# Patient Record
Sex: Male | Born: 1990
Health system: Southern US, Community
[De-identification: ages and names within clinical notes are randomized; demographics above are authoritative.]

## PROBLEM LIST (undated history)

## (undated) DIAGNOSIS — R011 Cardiac murmur, unspecified: Secondary | ICD-10-CM

## (undated) DIAGNOSIS — R079 Chest pain, unspecified: Secondary | ICD-10-CM

## (undated) DIAGNOSIS — Z9889 Other specified postprocedural states: Secondary | ICD-10-CM

## (undated) DIAGNOSIS — Z8489 Family history of other specified conditions: Secondary | ICD-10-CM

## (undated) DIAGNOSIS — A048 Other specified bacterial intestinal infections: Secondary | ICD-10-CM

## (undated) DIAGNOSIS — F419 Anxiety disorder, unspecified: Secondary | ICD-10-CM

## (undated) DIAGNOSIS — K8020 Calculus of gallbladder without cholecystitis without obstruction: Secondary | ICD-10-CM

## (undated) DIAGNOSIS — M25511 Pain in right shoulder: Secondary | ICD-10-CM

## (undated) DIAGNOSIS — M545 Low back pain, unspecified: Secondary | ICD-10-CM

## (undated) DIAGNOSIS — R0600 Dyspnea, unspecified: Secondary | ICD-10-CM

## (undated) DIAGNOSIS — G8929 Other chronic pain: Secondary | ICD-10-CM

## (undated) DIAGNOSIS — E785 Hyperlipidemia, unspecified: Secondary | ICD-10-CM

## (undated) DIAGNOSIS — E559 Vitamin D deficiency, unspecified: Secondary | ICD-10-CM

## (undated) DIAGNOSIS — G43409 Hemiplegic migraine, not intractable, without status migrainosus: Secondary | ICD-10-CM

## (undated) DIAGNOSIS — R030 Elevated blood-pressure reading, without diagnosis of hypertension: Secondary | ICD-10-CM

## (undated) DIAGNOSIS — R112 Nausea with vomiting, unspecified: Secondary | ICD-10-CM

## (undated) HISTORY — DX: Cardiac murmur, unspecified: R01.1

## (undated) HISTORY — DX: Anxiety disorder, unspecified: F41.9

## (undated) HISTORY — PX: KNEE ARTHROSCOPY: SUR90

## (undated) HISTORY — DX: Chest pain, unspecified: R07.9

## (undated) HISTORY — DX: Vitamin D deficiency, unspecified: E55.9

## (undated) HISTORY — DX: Pain in right shoulder: M25.511

## (undated) HISTORY — DX: Other specified bacterial intestinal infections: A04.8

## (undated) HISTORY — DX: Other chronic pain: G89.29

## (undated) HISTORY — DX: Low back pain, unspecified: M54.50

## (undated) HISTORY — DX: Dyspnea, unspecified: R06.00

## (undated) HISTORY — DX: Hemiplegic migraine, not intractable, without status migrainosus: G43.409

## (undated) HISTORY — DX: Hyperlipidemia, unspecified: E78.5

## (undated) HISTORY — DX: Low back pain: M54.5

## (undated) HISTORY — DX: Elevated blood-pressure reading, without diagnosis of hypertension: R03.0

## (undated) HISTORY — DX: Calculus of gallbladder without cholecystitis without obstruction: K80.20

---
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
## 2008-08-14 ENCOUNTER — Emergency Department (HOSPITAL_COMMUNITY): Admission: EM | Admit: 2008-08-14 | Discharge: 2008-08-14 | Payer: Self-pay | Admitting: Emergency Medicine

## 2010-10-23 ENCOUNTER — Emergency Department (HOSPITAL_COMMUNITY)
Admission: EM | Admit: 2010-10-23 | Discharge: 2010-10-23 | Disposition: A | Discharge status: Home / Self Care | Payer: BC Managed Care – PPO | Attending: Emergency Medicine | Admitting: Emergency Medicine

## 2012-08-12 ENCOUNTER — Emergency Department (HOSPITAL_COMMUNITY): Payer: Self-pay

## 2012-08-12 ENCOUNTER — Encounter (HOSPITAL_COMMUNITY): Payer: Self-pay | Admitting: Emergency Medicine

## 2012-08-12 ENCOUNTER — Emergency Department (HOSPITAL_COMMUNITY)
Admission: EM | Admit: 2012-08-12 | Discharge: 2012-08-12 | Disposition: A | Discharge status: Home / Self Care | Payer: Self-pay | Attending: Emergency Medicine | Admitting: Emergency Medicine

## 2012-08-12 DIAGNOSIS — S0990XA Unspecified injury of head, initial encounter: Secondary | ICD-10-CM | POA: Insufficient documentation

## 2012-08-12 DIAGNOSIS — Y92410 Unspecified street and highway as the place of occurrence of the external cause: Secondary | ICD-10-CM | POA: Insufficient documentation

## 2012-08-12 DIAGNOSIS — S8001XA Contusion of right knee, initial encounter: Secondary | ICD-10-CM

## 2012-08-12 DIAGNOSIS — R011 Cardiac murmur, unspecified: Secondary | ICD-10-CM | POA: Insufficient documentation

## 2012-08-12 DIAGNOSIS — S8000XA Contusion of unspecified knee, initial encounter: Secondary | ICD-10-CM | POA: Insufficient documentation

## 2012-08-12 DIAGNOSIS — S239XXA Sprain of unspecified parts of thorax, initial encounter: Secondary | ICD-10-CM

## 2012-08-12 DIAGNOSIS — Y9389 Activity, other specified: Secondary | ICD-10-CM | POA: Insufficient documentation

## 2012-08-12 DIAGNOSIS — S298XXA Other specified injuries of thorax, initial encounter: Secondary | ICD-10-CM | POA: Insufficient documentation

## 2012-08-12 HISTORY — DX: Cardiac murmur, unspecified: R01.1

## 2012-08-12 LAB — POCT I-STAT, CHEM 8
BUN: 11 mg/dL (ref 6–23)
Calcium, Ion: 1.19 mmol/L (ref 1.12–1.23)
HCT: 44 % (ref 39.0–52.0)
Sodium: 144 mEq/L (ref 135–145)
TCO2: 25 mmol/L (ref 0–100)

## 2012-08-12 LAB — CBC WITH DIFFERENTIAL/PLATELET
Basophils Absolute: 0 10*3/uL (ref 0.0–0.1)
Basophils Relative: 0 % (ref 0–1)
Eosinophils Relative: 3 % (ref 0–5)
Lymphocytes Relative: 27 % (ref 12–46)
MCHC: 34.9 g/dL (ref 30.0–36.0)
MCV: 82.1 fL (ref 78.0–100.0)
Neutro Abs: 6.1 10*3/uL (ref 1.7–7.7)
Platelets: 205 10*3/uL (ref 150–400)
RDW: 13.8 % (ref 11.5–15.5)
WBC: 9.8 10*3/uL (ref 4.0–10.5)

## 2012-08-12 MED ORDER — MORPHINE SULFATE 4 MG/ML IJ SOLN
4.0000 mg | Freq: Once | INTRAMUSCULAR | Status: AC
  Administered 2012-08-12: 4 mg via INTRAVENOUS
  Filled 2012-08-12: qty 1

## 2012-08-12 MED ORDER — IBUPROFEN 800 MG PO TABS: 800.0000 mg | ORAL_TABLET | Freq: Three times a day (TID) | ORAL | Status: DC | PRN

## 2012-08-12 MED ORDER — HYDROCODONE-ACETAMINOPHEN 5-325 MG PO TABS: 1.0000 | ORAL_TABLET | Freq: Four times a day (QID) | ORAL | Status: DC | PRN
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
## 2012-08-12 NOTE — ED Notes (Signed)
Patient report obtained from [facility], care taken over at this time.
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
## 2012-08-12 NOTE — ED Notes (Signed)
Pt to XR

## 2012-08-12 NOTE — ED Notes (Signed)
Patient laying on stretcher at this time. States he is having pain in head, neck and back. Rates pain at 6/10. No acute distress noted, resp are even and unlabored. Patient currently wearing c-collar. Plan of care discussed with patient, awaiting results. Call light at bedside with patient. Will continue to monitor.
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
## 2016-08-07 DIAGNOSIS — H9202 Otalgia, left ear: Secondary | ICD-10-CM | POA: Insufficient documentation

## 2016-08-07 NOTE — ED Triage Notes (Signed)
Pt was cleaning left ear with a qtip and believes he might have injured his ear while doing so. Co pain to earache at this time.

## 2016-08-08 ENCOUNTER — Emergency Department
Admission: EM | Admit: 2016-08-08 | Discharge: 2016-08-08 | Disposition: A | Discharge status: Home / Self Care | Payer: Self-pay | Attending: Emergency Medicine | Admitting: Emergency Medicine

## 2016-08-08 DIAGNOSIS — H9202 Otalgia, left ear: Secondary | ICD-10-CM

## 2016-08-08 MED ORDER — IBUPROFEN 800 MG PO TABS
800.0000 mg | ORAL_TABLET | Freq: Once | ORAL | Status: AC
  Administered 2016-08-08: 800 mg via ORAL

## 2016-08-08 MED ORDER — IBUPROFEN 800 MG PO TABS
ORAL_TABLET | ORAL | Status: AC
  Administered 2016-08-08: 800 mg via ORAL
  Filled 2016-08-08: qty 1

## 2016-08-08 NOTE — Discharge Instructions (Signed)
Please take ibuprofen and/or Tylenol for pain. Please contact the ear, nose, and throat doctor later today for follow-up in the office. Return especially for fever, persistent nausea vomiting decreased hearing or any other new concerns.  Please return immediately if condition worsens. Please contact her primary physician or the physician you were given for referral. If you have any specialist physicians involved in her treatment and plan please also contact them. Thank you for using [facility].

## 2016-08-08 NOTE — ED Provider Notes (Signed)
Time Seen: Approximately0345 I have reviewed the triage notes  Chief Complaint: Otalgia   History of Present Illness: Darrell Hughes is a 26 y.o. male *who states that earlier this evening that he injured his left ear while using a Q-tip. Patient states he feels that the Q-tip broke off and got lodged in his ureter canal. Some left ear pain without discharge or drainage.   Past Medical History:  Diagnosis Date  . Murmur, cardiac     There are no active problems to display for this patient.   Past Surgical History:  Procedure Laterality Date  . KNEE ARTHROSCOPY      Past Surgical History:  Procedure Laterality Date  . KNEE ARTHROSCOPY      Current Outpatient Rx  . Order #: [id number] Class: Print  . Order #: [id number] Class: Print    Allergies:  Patient has no known allergies.  Family History: No family history on file.  Social History: Social History  Substance Use Topics  . Smoking status: Never Smoker  . Smokeless tobacco: Not on file  . Alcohol use No     Review of Systems:   Constitutional: No fever Eyes: No visual disturbances ENT: No sore throat, No right ear pain or deafness Cardiac: No chest pain Respiratory: No shortness of breath, wheezing, or stridor Neurologic: No focal weakness, trouble with speech or swollowing. No vertiginous symptoms  Physical Exam:  ED Triage Vitals  Enc Vitals Group     BP 08/07/16 2231 (!) 142/81     Pulse Rate 08/07/16 2231 (!) 55     Resp 08/07/16 2231 18     Temp 08/07/16 2231 97.8 F (36.6 C)     Temp Source 08/07/16 2231 Oral     SpO2 08/07/16 2231 100 %     Weight 08/07/16 2231 254 lb (115.2 kg)     Height 08/07/16 2231 6' (1.829 m)     Head Circumference --      Peak Flow --      Pain Score 08/07/16 2230 6     Pain Loc --      Pain Edu? --      Excl. in GC? --     General: Awake , Alert , and Oriented times 3; GCS 15 Head: Normal cephalic , atraumatic Eyes: Pupils equal , round, reactive to  light Nose/Throat: No nasal drainage, patent upper airway without erythema or exudate. Examination of the left ear canal does not show any foreign body noted. I viewed the ureter on 2 separate occasions and cannot see any foreign body. There is some irritation at the base of the ear canal with no discharge or drainage and visualization of tympanic membrane appears to be intact without erythema or puncture wound.  Lungs: Clear to ascultation without wheezes , rhonchi, or rales Heart: Regular rate, regular rhythm without murmurs , gallops , or rubs   L ED Course:  The patient was considered to have his left ear irrigated but without any visible foreign body noted was hesitant to irrigate his ear at this time. He may just have some feelings of a foreign body with ear canal abrasion. The patient was advised take over-the-counter ibuprofen and Tylenol for the time being and was referred to otolaryngology on an outpatient basis.      Final Clinical Impression:  Final diagnoses:  Otalgia of left ear     Plan:  Outpatient Patient was advised to return immediately if condition worsens. Patient was advised to follow up  with their primary care physician or other specialized physicians involved in their outpatient care. The patient and/or family member/power of attorney had laboratory results reviewed at the bedside. All questions and concerns were addressed and appropriate discharge instructions were distributed by the nursing staff.             Jennye Moccasin, MD 08/08/16 [phone]

## 2017-01-15 ENCOUNTER — Emergency Department
Admission: EM | Admit: 2017-01-15 | Discharge: 2017-01-15 | Disposition: A | Discharge status: Home / Self Care | Payer: Medicaid Other | Attending: Emergency Medicine | Admitting: Emergency Medicine

## 2017-01-15 ENCOUNTER — Emergency Department: Payer: Medicaid Other

## 2017-01-15 ENCOUNTER — Encounter: Payer: Self-pay | Admitting: Emergency Medicine

## 2017-01-15 DIAGNOSIS — Y929 Unspecified place or not applicable: Secondary | ICD-10-CM | POA: Insufficient documentation

## 2017-01-15 DIAGNOSIS — S59912A Unspecified injury of left forearm, initial encounter: Secondary | ICD-10-CM | POA: Diagnosis present

## 2017-01-15 DIAGNOSIS — S5012XA Contusion of left forearm, initial encounter: Secondary | ICD-10-CM | POA: Diagnosis not present

## 2017-01-15 DIAGNOSIS — Y999 Unspecified external cause status: Secondary | ICD-10-CM | POA: Diagnosis not present

## 2017-01-15 DIAGNOSIS — Y9301 Activity, walking, marching and hiking: Secondary | ICD-10-CM | POA: Insufficient documentation

## 2017-01-15 DIAGNOSIS — S40022A Contusion of left upper arm, initial encounter: Secondary | ICD-10-CM

## 2017-01-15 MED ORDER — LIDOCAINE 5 % EX PTCH: 1.0000 | MEDICATED_PATCH | Freq: Two times a day (BID) | CUTANEOUS | 0 refills | Status: DC

## 2017-01-15 MED ORDER — ACETAMINOPHEN 325 MG PO TABS
650.0000 mg | ORAL_TABLET | Freq: Once | ORAL | Status: AC
Start: 2017-01-15 — End: 2017-01-15
  Administered 2017-01-15: 650 mg via ORAL

## 2017-01-15 MED ORDER — KETOROLAC TROMETHAMINE 60 MG/2ML IM SOLN
60.0000 mg | Freq: Once | INTRAMUSCULAR | Status: AC
  Administered 2017-01-15: 60 mg via INTRAMUSCULAR

## 2017-01-15 MED ORDER — ACETAMINOPHEN 325 MG PO TABS
ORAL_TABLET | ORAL | Status: AC
  Filled 2017-01-15: qty 2

## 2017-01-15 MED ORDER — ETODOLAC 200 MG PO CAPS: 200.0000 mg | ORAL_CAPSULE | Freq: Three times a day (TID) | ORAL | 0 refills | Status: DC

## 2017-01-15 MED ORDER — KETOROLAC TROMETHAMINE 60 MG/2ML IM SOLN
INTRAMUSCULAR | Status: AC
  Filled 2017-01-15: qty 2
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
## 2017-01-15 NOTE — Discharge Instructions (Signed)
Please follow up with the acute care clinic. Your xray did not show any acute fractures but should your pain persist then you may need orthopedic evaluation.

## 2017-03-10 ENCOUNTER — Emergency Department (HOSPITAL_COMMUNITY)
Admission: EM | Admit: 2017-03-10 | Discharge: 2017-03-11 | Disposition: A | Discharge status: Home / Self Care | Payer: Medicaid Other | Attending: Emergency Medicine | Admitting: Emergency Medicine

## 2017-03-10 ENCOUNTER — Emergency Department (HOSPITAL_COMMUNITY): Payer: Medicaid Other

## 2017-03-10 ENCOUNTER — Encounter (HOSPITAL_COMMUNITY): Payer: Self-pay | Admitting: Emergency Medicine

## 2017-03-10 DIAGNOSIS — R52 Pain, unspecified: Secondary | ICD-10-CM

## 2017-03-10 DIAGNOSIS — R197 Diarrhea, unspecified: Secondary | ICD-10-CM | POA: Diagnosis not present

## 2017-03-10 DIAGNOSIS — R112 Nausea with vomiting, unspecified: Secondary | ICD-10-CM | POA: Diagnosis not present

## 2017-03-10 DIAGNOSIS — K8020 Calculus of gallbladder without cholecystitis without obstruction: Secondary | ICD-10-CM | POA: Insufficient documentation

## 2017-03-10 DIAGNOSIS — R1011 Right upper quadrant pain: Secondary | ICD-10-CM

## 2017-03-10 LAB — URINALYSIS, ROUTINE W REFLEX MICROSCOPIC
BACTERIA UA: NONE SEEN
Bilirubin Urine: NEGATIVE
Glucose, UA: NEGATIVE mg/dL
Hgb urine dipstick: NEGATIVE
KETONES UR: NEGATIVE mg/dL
LEUKOCYTES UA: NEGATIVE
Nitrite: NEGATIVE
PH: 6 (ref 5.0–8.0)
PROTEIN: NEGATIVE mg/dL
Specific Gravity, Urine: 1.026 (ref 1.005–1.030)

## 2017-03-10 LAB — CBC
HEMATOCRIT: 43.1 % (ref 39.0–52.0)
HEMOGLOBIN: 14.4 g/dL (ref 13.0–17.0)
MCH: 28.6 pg (ref 26.0–34.0)
MCHC: 33.4 g/dL (ref 30.0–36.0)
MCV: 85.5 fL (ref 78.0–100.0)
Platelets: 232 10*3/uL (ref 150–400)
RBC: 5.04 MIL/uL (ref 4.22–5.81)
RDW: 14 % (ref 11.5–15.5)
WBC: 9.1 10*3/uL (ref 4.0–10.5)

## 2017-03-10 LAB — COMPREHENSIVE METABOLIC PANEL
ALT: 15 U/L — AB (ref 17–63)
ANION GAP: 9 (ref 5–15)
AST: 21 U/L (ref 15–41)
Albumin: 4.4 g/dL (ref 3.5–5.0)
Alkaline Phosphatase: 72 U/L (ref 38–126)
BUN: 15 mg/dL (ref 6–20)
CHLORIDE: 106 mmol/L (ref 101–111)
CO2: 25 mmol/L (ref 22–32)
CREATININE: 1.17 mg/dL (ref 0.61–1.24)
Calcium: 9.3 mg/dL (ref 8.9–10.3)
GFR calc non Af Amer: >60 mL/min (ref >60)
Glucose, Bld: 85 mg/dL (ref 65–99)
POTASSIUM: 3.8 mmol/L (ref 3.5–5.1)
SODIUM: 140 mmol/L (ref 135–145)
Total Bilirubin: 0.3 mg/dL (ref 0.3–1.2)
Total Protein: 6.8 g/dL (ref 6.5–8.1)

## 2017-03-10 LAB — LIPASE, BLOOD: LIPASE: 25 U/L (ref 11–51)

## 2017-03-10 MED ORDER — SODIUM CHLORIDE 0.9 % IV BOLUS (SEPSIS)
1000.0000 mL | Freq: Once | INTRAVENOUS | Status: AC
  Administered 2017-03-11: 1000 mL via INTRAVENOUS

## 2017-03-10 MED ORDER — MORPHINE SULFATE (PF) 4 MG/ML IV SOLN
4.0000 mg | Freq: Once | INTRAVENOUS | Status: AC
  Administered 2017-03-11: 4 mg via INTRAVENOUS
  Filled 2017-03-10: qty 1

## 2017-03-10 MED ORDER — ONDANSETRON HCL 4 MG/2ML IJ SOLN
4.0000 mg | Freq: Once | INTRAMUSCULAR | Status: AC
  Administered 2017-03-11: 4 mg via INTRAVENOUS
  Filled 2017-03-10: qty 2

## 2017-03-10 NOTE — ED Provider Notes (Signed)
[facility] Provider Note   CSN: [id number] Arrival date & time: 03/10/17  1903     History   Chief Complaint Chief Complaint  Patient presents with  . Abdominal Pain  . Emesis    HPI   Blood pressure (!) 148/84, pulse (!) 53, temperature 98.7 F (37.1 C), temperature source Oral, resp. rate 16, height 6' (1.829 m), weight 120.2 kg (265 lb), SpO2 100 %.  Darrell Hughes is a 26 y.o. male complaining of right upper quadrant pain intermittently over the course of the last week he states is exacerbated postprandially, he started having nonbloody, nonbilious, non-coffee-ground emesis yesterday.  He states that he cannot keep down any fluids or food at this time, he is tried acetaminophen and ibuprofen at home with little relief.  He also has been constipated over the last several days but had diarrhea today.  He denies any fever, chills, sick contacts with similar symptoms.  He has had some sick contacts at have upper respiratory complaints.  He rates his pain at 7 out of 10 and describes it as sharp and achy and has no alleviating factors but it is exacerbated postprandially.  Past Medical History:  Diagnosis Date  . Murmur, cardiac     There are no active problems to display for this patient.   Past Surgical History:  Procedure Laterality Date  . KNEE ARTHROSCOPY         Home Medications    Prior to Admission medications   Medication Sig Start Date End Date Taking? Authorizing Provider  HYDROcodone-acetaminophen (NORCO/VICODIN) 5-325 MG tablet Take 1-2 tablets by mouth every 6 hours as needed for pain. 03/11/17   Shalena Ezzell, Joni ReiningNicole, PA-C  ondansetron (ZOFRAN) 4 MG tablet Take 1 tablet (4 mg total) by mouth every 8 (eight) hours as needed for nausea or vomiting. 03/11/17   Lynne Righi, Mardella LaymanNicole, PA-C    Family History No family history on file.  Social History Social History  Substance Use Topics  . Smoking status: Never Smoker  .  Smokeless tobacco: Never Used  . Alcohol use No     Allergies   Patient has no known allergies.   Review of Systems Review of Systems   A complete review of systems was obtained and all systems are negative except as noted in the HPI and PMH.   Physical Exam Updated Vital Signs BP 133/78   Pulse (!) 59   Temp 98.7 F (37.1 C) (Oral)   Resp 16   Ht 6' (1.829 m)   Wt 120.2 kg (265 lb)   SpO2 98%   BMI 35.94 kg/m   Physical Exam  Constitutional: He is oriented to person, place, and time. He appears well-developed and well-nourished. No distress.  HENT:  Head: Normocephalic and atraumatic.  Mouth/Throat: Oropharynx is clear and moist.  Eyes: Pupils are equal, round, and reactive to light. Conjunctivae and EOM are normal.  Neck: Normal range of motion.  Cardiovascular: Normal rate, regular rhythm and intact distal pulses.   Pulmonary/Chest: Effort normal and breath sounds normal. No respiratory distress. He has no wheezes. He has no rales. He exhibits no tenderness.  Abdominal: Soft. He exhibits no distension and no mass. There is tenderness. There is no rebound and no guarding. No hernia.  Tender palpation in the right upper quadrant with no guarding or rebound.  Musculoskeletal: Normal range of motion.  Neurological: He is alert and oriented to person, place, and time. He displays normal reflexes. No cranial nerve  deficit or sensory deficit. He exhibits normal muscle tone. Coordination normal.  Skin: Capillary refill takes less than 2 seconds. He is not diaphoretic.  Psychiatric: He has a normal mood and affect.  Nursing note and vitals reviewed.    ED Treatments / Results  Labs (all labs ordered are listed, but only abnormal results are displayed) Labs Reviewed  COMPREHENSIVE METABOLIC PANEL - Abnormal; Notable for the following:       Result Value   ALT 15 (*)    All other components within normal limits  URINALYSIS, ROUTINE W REFLEX MICROSCOPIC - Abnormal;  Notable for the following:    Squamous Epithelial / LPF 0-5 (*)    All other components within normal limits  LIPASE, BLOOD  CBC    EKG  EKG Interpretation None       Radiology US Abdomen Limited Ruq  Result Date: 03/11/2017 CLINICAL DATA:  Pain for 6 months EXAM: ULTRASOUND ABDOMEN LIMITED RIGHT UPPER QUADRANT COMPARISON:  None. FINDINGS: Gallbladder: Multiple stones measuring up to 8 mm. Small gallbladder polyps measuring up to 5 mm. Normal wall thickness. Negative sonographic Murphy. Common bile duct: Diameter: 2.8 mm Liver: No focal lesion identified. Within normal limits in parenchymal echogenicity. Portal vein is patent on color Doppler imaging with normal direction of blood flow towards the liver. IMPRESSION: 1. Cholelithiasis and small gallbladder polyps. No evidence for acute cholecystitis or biliary dilatation Electronically Signed   By: Jasmine Pang M.D.   On: 03/11/2017 00:09    Procedures Procedures (including critical care time)  Medications Ordered in ED Medications  sodium chloride 0.9 % bolus 1,000 mL (1,000 mLs Intravenous New Bag/Given 03/11/17 0008)  morphine 4 MG/ML injection 4 mg (4 mg Intravenous Given 03/11/17 0008)  ondansetron (ZOFRAN) injection 4 mg (4 mg Intravenous Given 03/11/17 0008)     Initial Impression / Assessment and Plan / ED Course  I have reviewed the triage vital signs and the nursing notes.  Pertinent labs & imaging results that were available during my care of the patient were reviewed by me and considered in my medical decision making (see chart for details).     Vitals:   03/10/17 2206 03/10/17 2215 03/10/17 2245 03/10/17 2315  BP: (!) 148/84 (!) 158/99 (!) 143/85 133/78  Pulse: (!) 53 (!) 56 (!) 55 (!) 59  Resp: 16     Temp: 98.7 F (37.1 C)     TempSrc: Oral     SpO2: 100% 100% 99% 98%  Weight:      Height:        Medications  sodium chloride 0.9 % bolus 1,000 mL (1,000 mLs Intravenous New Bag/Given 03/11/17 0008)    morphine 4 MG/ML injection 4 mg (4 mg Intravenous Given 03/11/17 0008)  ondansetron (ZOFRAN) injection 4 mg (4 mg Intravenous Given 03/11/17 0008)    Darrell Hughes is 26 y.o. male presenting with epigastric abdominal pain, worse postprandially and that he had nausea vomiting and diarrhea starting yesterday.,  Patient afebrile, he had a nonsurgical abdominal exam.  Blood work is reassuring, he has a history of gallstones but will check a right upper quadrant ultrasound.  Ultrasound with no signs of cholecystitis, they do note gallstones in the gallbladder.  Extensive discussion of return precautions for cholecystitis patient verbalized understanding by teach back technique.  Evaluation does not show pathology that would require ongoing emergent intervention or inpatient treatment. Pt is hemodynamically stable and mentating appropriately. Discussed findings and plan with patient/guardian, who agrees with care plan.  All questions answered. Return precautions discussed and outpatient follow up given.   Final Clinical Impressions(s) / ED Diagnoses   Final diagnoses:  Pain  Nausea vomiting and diarrhea  Right upper quadrant abdominal pain  Gallstones    New Prescriptions New Prescriptions   HYDROCODONE-ACETAMINOPHEN (NORCO/VICODIN) 5-325 MG TABLET    Take 1-2 tablets by mouth every 6 hours as needed for pain.   ONDANSETRON (ZOFRAN) 4 MG TABLET    Take 1 tablet (4 mg total) by mouth every 8 (eight) hours as needed for nausea or vomiting.     Kaylyn Lim 03/11/17 0112    Doug Sou, MD 03/14/17 [phone]

## 2017-03-10 NOTE — ED Notes (Signed)
Pt in ultrasound

## 2017-03-10 NOTE — ED Notes (Signed)
Pt having abd pain rt upper quadrant for 2 months  He has been diagnosed with gallstones  His pain has increased in intensity   He did not have insurance until recently  Alert oriented skin warm and dry

## 2017-03-10 NOTE — ED Triage Notes (Signed)
Pt having abd pain with nausea and vomiting for the past 3 days, pt states he has a hx of gallbladder problems.

## 2017-03-10 NOTE — ED Notes (Signed)
The pt has difficulty with increased  Pain whenever he eats

## 2017-03-11 MED ORDER — HYDROCODONE-ACETAMINOPHEN 5-325 MG PO TABS: ORAL_TABLET | ORAL | 0 refills | Status: DC

## 2017-03-11 MED ORDER — ONDANSETRON HCL 4 MG PO TABS: 4.0000 mg | ORAL_TABLET | Freq: Three times a day (TID) | ORAL | 0 refills | Status: DC | PRN

## 2017-03-11 NOTE — Discharge Instructions (Signed)
Take vicodin for breakthrough pain, do not drink alcohol, drive, care for children or do other critical tasks while taking vicodin.  Do not hesitate to return to the emergency room for any new, worsening or concerning symptoms including fever, chills, worsening pain or uncontrollable vomiting.

## 2017-04-12 ENCOUNTER — Encounter: Payer: Self-pay | Admitting: *Deleted

## 2017-04-23 ENCOUNTER — Encounter: Payer: Self-pay | Admitting: Emergency Medicine

## 2017-04-23 ENCOUNTER — Emergency Department
Admission: EM | Admit: 2017-04-23 | Discharge: 2017-04-23 | Disposition: A | Discharge status: Home / Self Care | Payer: Worker's Compensation | Attending: Emergency Medicine | Admitting: Emergency Medicine

## 2017-04-23 DIAGNOSIS — Z79899 Other long term (current) drug therapy: Secondary | ICD-10-CM | POA: Diagnosis not present

## 2017-04-23 DIAGNOSIS — S61411A Laceration without foreign body of right hand, initial encounter: Secondary | ICD-10-CM | POA: Insufficient documentation

## 2017-04-23 DIAGNOSIS — Y929 Unspecified place or not applicable: Secondary | ICD-10-CM | POA: Insufficient documentation

## 2017-04-23 DIAGNOSIS — W268XXA Contact with other sharp object(s), not elsewhere classified, initial encounter: Secondary | ICD-10-CM | POA: Diagnosis not present

## 2017-04-23 DIAGNOSIS — Y990 Civilian activity done for income or pay: Secondary | ICD-10-CM | POA: Diagnosis not present

## 2017-04-23 DIAGNOSIS — Y9389 Activity, other specified: Secondary | ICD-10-CM | POA: Diagnosis not present

## 2017-04-23 DIAGNOSIS — Z23 Encounter for immunization: Secondary | ICD-10-CM | POA: Insufficient documentation

## 2017-04-23 MED ORDER — LIDOCAINE 5 % EX PTCH
MEDICATED_PATCH | CUTANEOUS | Status: AC
  Filled 2017-04-23: qty 1

## 2017-04-23 MED ORDER — TETANUS-DIPHTH-ACELL PERTUSSIS 5-2.5-18.5 LF-MCG/0.5 IM SUSP
0.5000 mL | Freq: Once | INTRAMUSCULAR | Status: AC
  Administered 2017-04-23: 0.5 mL via INTRAMUSCULAR
  Filled 2017-04-23: qty 0.5

## 2017-04-23 MED ORDER — TRAMADOL HCL 50 MG PO TABS: 50.0000 mg | ORAL_TABLET | Freq: Four times a day (QID) | ORAL | 0 refills | Status: DC | PRN

## 2017-04-23 MED ORDER — LIDOCAINE HCL (PF) 1 % IJ SOLN
INTRAMUSCULAR | Status: AC
  Administered 2017-04-23: 5 mL
  Filled 2017-04-23: qty 5

## 2017-04-23 NOTE — ED Notes (Signed)
See triage note  Presents with laceration to right hand  States he was using a knife to cut boxes and slipped   Bleeding controlled at present

## 2017-04-23 NOTE — ED Provider Notes (Signed)
[facility] Emergency Department Provider Note   ____________________________________________   First MD Initiated Contact with Patient 04/23/17 1441     (approximate)  I have reviewed the triage vital signs and the nursing notes.   HISTORY  Chief Complaint Laceration    HPI Darrell Hughes is a 26 y.o. male patient presents laceration palmar aspect base of the right thumb. Patient say injury occurred while working on a box. Patient state bleeding was controlled direct pressure. Patient denies loss of sensation or loss of function of the affected extremity. Patient tetanus shot is not up-to-date.Patient rates pain as a 9/10. Patient described a pain as "sharp".   Past Medical History:  Diagnosis Date  . Murmur, cardiac     There are no active problems to display for this patient.   Past Surgical History:  Procedure Laterality Date  . KNEE ARTHROSCOPY      Prior to Admission medications   Medication Sig Start Date End Date Taking? Authorizing Provider  HYDROcodone-acetaminophen (NORCO/VICODIN) 5-325 MG tablet Take 1-2 tablets by mouth every 6 hours as needed for pain. 03/11/17   Pisciotta, Joni ReiningNicole, PA-C  ondansetron (ZOFRAN) 4 MG tablet Take 1 tablet (4 mg total) by mouth every 8 (eight) hours as needed for nausea or vomiting. 03/11/17   Pisciotta, Joni ReiningNicole, PA-C  traMADol (ULTRAM) 50 MG tablet Take 1 tablet (50 mg total) by mouth every 6 (six) hours as needed. 04/23/17 04/23/18  Joni ReiningSmith, Rhemi Balbach K, PA-C    Allergies Patient has no known allergies.  No family history on file.  Social History Social History   Tobacco Use  . Smoking status: Never Smoker  . Smokeless tobacco: Never Used  Substance Use Topics  . Alcohol use: No  . Drug use: No    Review of Systems Constitutional: No fever/chills Eyes: No visual changes. ENT: No sore throat. Cardiovascular: Denies chest pain. Respiratory: Denies shortness of breath. Gastrointestinal: No  abdominal pain.  No nausea, no vomiting.  No diarrhea.  No constipation. Genitourinary: Negative for dysuria. Musculoskeletal: Negative for back pain. Skin: Laceration right hand Neurological: Negative for headaches, focal weakness or numbness.   ____________________________________________   PHYSICAL EXAM:  VITAL SIGNS: ED Triage Vitals  Enc Vitals Group     BP 04/23/17 1316 (!) 155/83     Pulse Rate 04/23/17 1316 60     Resp 04/23/17 1316 16     Temp 04/23/17 1316 98.1 F (36.7 C)     Temp Source 04/23/17 1316 Oral     SpO2 04/23/17 1316 100 %     Weight 04/23/17 1316 264 lb (119.7 kg)     Height 04/23/17 1316 6' (1.829 m)     Head Circumference --      Peak Flow --      Pain Score 04/23/17 1319 9     Pain Loc --      Pain Edu? --      Excl. in GC? --     Constitutional: Alert and oriented. Well appearing and in no acute distress. Cardiovascular: Normal rate, regular rhythm. Grossly normal heart sounds.  Good peripheral circulation. Respiratory: Normal respiratory effort.  No retractions. Lungs CTAB. Gastrointestinal: Soft and nontender. No distention. No abdominal bruits. No CVA tenderness. Musculoskeletal: No lower extremity tenderness nor edema.  No joint effusions. Neurologic:  Normal speech and language. No gross focal neurologic deficits are appreciated. No gait instability. Skin: 1.5 cm laceration palmar aspect right hand. Psychiatric: Mood and affect are normal. Speech and  behavior are normal.  ____________________________________________   LABS (all labs ordered are listed, but only abnormal results are displayed)  Labs Reviewed - No data to display ____________________________________________  EKG   ____________________________________________  RADIOLOGY  No results found.  ____________________________________________   PROCEDURES  Procedure(s) performed: None  .Marland Kitchen.Laceration Repair Date/Time: 04/23/2017 3:40 PM Performed by: Joni ReiningSmith, Marybel Alcott  K, PA-C Authorized by: Joni ReiningSmith, Catalyna Reilly K, PA-C   Consent:    Consent obtained:  Verbal   Consent given by:  Patient   Risks discussed:  Infection Anesthesia (see MAR for exact dosages):    Anesthesia method:  Local infiltration   Local anesthetic:  Lidocaine 1% w/o epi Laceration details:    Location:  Hand   Hand location:  R palm   Length (cm):  1.5 Repair type:    Repair type:  Simple Pre-procedure details:    Preparation:  Patient was prepped and draped in usual sterile fashion Exploration:    Hemostasis achieved with:  Direct pressure   Wound extent: nerve damage, tendon damage and vascular damage     Wound extent: no foreign bodies/material noted     Contaminated: no   Treatment:    Area cleansed with:  Betadine and saline   Amount of cleaning:  Standard   Irrigation solution:  Sterile saline Skin repair:    Repair method:  Sutures   Suture size:  3-0   Suture material:  Nylon   Suture technique:  Simple interrupted Approximation:    Approximation:  Close Post-procedure details:    Dressing:  Antibiotic ointment, non-adherent dressing and sterile dressing   Patient tolerance of procedure:  Tolerated well, no immediate complications    Critical Care performed: No  ____________________________________________   INITIAL IMPRESSION / ASSESSMENT AND PLAN / ED COURSE  As part of my medical decision making, I reviewed the following data within the electronic MEDICAL RECORD NUMBER    Right hand laceration. Status post vision patient given discharge Instructions. Patient was given tetanus shot prior to departure. Patient advised return back in 10 days for suture removal.      ____________________________________________   FINAL CLINICAL IMPRESSION(S) / ED DIAGNOSES  Final diagnoses:  Laceration of right hand without foreign body, initial encounter     ED Discharge Orders        Ordered    traMADol (ULTRAM) 50 MG tablet  Every 6 hours PRN     04/23/17 1535        Note:  This document was prepared using Dragon voice recognition software and may include unintentional dictation errors.    Joni ReiningSmith, Nahsir Venezia K, PA-C 04/23/17 1543    Nita SickleVeronese, [place], MD 04/26/17 [phone]

## 2017-04-23 NOTE — ED Triage Notes (Signed)
Patient presents to the ED with laceration to base of right thumb.  Patient was injured while working at [employer].  Patient is employed by [employer].  Patient is in no obvious distress at this time.  Patient states he was using a knife to cut open boxes.

## 2017-04-25 ENCOUNTER — Encounter: Payer: Self-pay | Admitting: General Surgery

## 2017-04-25 ENCOUNTER — Ambulatory Visit: Payer: Medicaid Other | Admitting: General Surgery

## 2017-04-25 VITALS — BP 130/80 | HR 86 | Resp 12 | Ht 72.0 in | Wt 270.0 lb

## 2017-04-25 DIAGNOSIS — K8010 Calculus of gallbladder with chronic cholecystitis without obstruction: Secondary | ICD-10-CM | POA: Diagnosis not present

## 2017-04-25 NOTE — Patient Instructions (Addendum)
Encouraged the use of miralax on a daily basis to reduce constipation.  Laparoscopic Cholecystectomy Laparoscopic cholecystectomy is surgery to remove the gallbladder. The gallbladder is a pear-shaped organ that lies beneath the liver on the right side of the body. The gallbladder stores bile, which is a fluid that helps the body to digest fats. Cholecystectomy is often done for inflammation of the gallbladder (cholecystitis). This condition is usually caused by a buildup of gallstones (cholelithiasis) in the gallbladder. Gallstones can block the flow of bile, which can result in inflammation and pain. In severe cases, emergency surgery may be required. This procedure is done though small incisions in your abdomen (laparoscopic surgery). A thin scope with a camera (laparoscope) is inserted through one incision. Thin surgical instruments are inserted through the other incisions. In some cases, a laparoscopic procedure may be turned into a type of surgery that is done through a larger incision (open surgery). Tell a health care provider about:  Any allergies you have.  All medicines you are taking, including vitamins, herbs, eye drops, creams, and over-the-counter medicines.  Any problems you or family members have had with anesthetic medicines.  Any blood disorders you have.  Any surgeries you have had.  Any medical conditions you have.  Whether you are pregnant or may be pregnant. What are the risks? Generally, this is a safe procedure. However, problems may occur, including:  Infection.  Bleeding.  Allergic reactions to medicines.  Damage to other structures or organs.  A stone remaining in the common bile duct. The common bile duct carries bile from the gallbladder into the small intestine.  A bile leak from the cyst duct that is clipped when your gallbladder is removed.  What happens before the procedure? Staying hydrated Follow instructions from your health care provider  about hydration, which may include:  Up to 2 hours before the procedure - you may continue to drink clear liquids, such as water, clear fruit juice, black coffee, and plain tea.  Eating and drinking restrictions Follow instructions from your health care provider about eating and drinking, which may include:  8 hours before the procedure - stop eating heavy meals or foods such as meat, fried foods, or fatty foods.  6 hours before the procedure - stop eating light meals or foods, such as toast or cereal.  6 hours before the procedure - stop drinking milk or drinks that contain milk.  2 hours before the procedure - stop drinking clear liquids.  Medicines  Ask your health care provider about: ? Changing or stopping your regular medicines. This is especially important if you are taking diabetes medicines or blood thinners. ? Taking medicines such as aspirin and ibuprofen. These medicines can thin your blood. Do not take these medicines before your procedure if your health care provider instructs you not to.  You may be given antibiotic medicine to help prevent infection. General instructions  Let your health care provider know if you develop a cold or an infection before surgery.  Plan to have someone take you home from the hospital or clinic.  Ask your health care provider how your surgical site will be marked or identified. What happens during the procedure?  To reduce your risk of infection: ? Your health care team will wash or sanitize their hands. ? Your skin will be washed with soap. ? Hair may be removed from the surgical area.  An IV tube may be inserted into one of your veins.  You will be given one  or more of the following: ? A medicine to help you relax (sedative). ? A medicine to make you fall asleep (general anesthetic).  A breathing tube will be placed in your mouth.  Your surgeon will make several small cuts (incisions) in your abdomen.  The laparoscope will be  inserted through one of the small incisions. The camera on the laparoscope will send images to a TV screen (monitor) in the operating room. This lets your surgeon see inside your abdomen.  Air-like gas will be pumped into your abdomen. This will expand your abdomen to give the surgeon more room to perform the surgery.  Other tools that are needed for the procedure will be inserted through the other incisions. The gallbladder will be removed through one of the incisions.  Your common bile duct may be examined. If stones are found in the common bile duct, they may be removed.  After your gallbladder has been removed, the incisions will be closed with stitches (sutures), staples, or skin glue.  Your incisions may be covered with a bandage (dressing). The procedure may vary among health care providers and hospitals. What happens after the procedure?  Your blood pressure, heart rate, breathing rate, and blood oxygen level will be monitored until the medicines you were given have worn off.  You will be given medicines as needed to control your pain.  Do not drive for 24 hours if you were given a sedative. This information is not intended to replace advice given to you by your health care provider. Make sure you discuss any questions you have with your health care provider. Document Released: 04/30/2005 Document Revised: 11/20/2015 Document Reviewed: 10/17/2015 Elsevier Interactive Patient Education  2018 [employer]  

## 2017-04-25 NOTE — Progress Notes (Signed)
Patient ID: Darrell Hughes, male   DOB: 03/17/1991, 26 y.o.   MRN: [id number]  Chief Complaint  Patient presents with  . Other    gall stones    HPI Darrell Nidanthony Joshua Spofford is a 26 y.o. male.  Here today for evaluation of gallstones referred by Dr Lacie ScottsNiemeyer. He has been having some constant right upper quadrant pain under his rib area for about 8 months. He feels salads make it worse. He does notice the pain radiating to the back pain as well. He does admit to mucous stools on occasion for about 1-2 months. He noticed that prune juice made it better. He does admit to constipation with some discomfort with a BM. Abdominal ultrasound was 03-11-17. He works for [employer] and had a right hand injury on Tuesday  HPI  Past Medical History:  Diagnosis Date  . Murmur, cardiac     Past Surgical History:  Procedure Laterality Date  . KNEE ARTHROSCOPY      Family History  Problem Relation Age of Onset  . Lung cancer Mother        and colon    Social History Social History   Tobacco Use  . Smoking status: Never Smoker  . Smokeless tobacco: Never Used  Substance Use Topics  . Alcohol use: No  . Drug use: No    No Known Allergies  Current Outpatient Medications  Medication Sig Dispense Refill  . traMADol (ULTRAM) 50 MG tablet Take 1 tablet (50 mg total) by mouth every 6 (six) hours as needed. 20 tablet 0   No current facility-administered medications for this visit.     Review of Systems Review of Systems  Constitutional: Negative.   Respiratory: Positive for cough.   Cardiovascular: Negative.   Gastrointestinal: Positive for abdominal pain and constipation. Negative for diarrhea.    Blood pressure 130/80, pulse 86, resp. rate 12, height 6' (1.829 m), weight 270 lb (122.5 kg).  Physical Exam Physical Exam  Constitutional: He is oriented to person, place, and time. He appears well-developed and well-nourished.  HENT:  Mouth/Throat: Oropharynx is clear and moist.   Eyes: Conjunctivae are normal. No scleral icterus.  Neck: Neck supple.  Cardiovascular: Normal rate, regular rhythm and normal heart sounds.  Pulmonary/Chest: Effort normal and breath sounds normal.  Abdominal: Soft. Normal appearance. There is tenderness in the right upper quadrant. There is negative Murphy's sign.  Lymphadenopathy:    He has no cervical adenopathy.  Neurological: He is alert and oriented to person, place, and time.  Skin: Skin is warm and dry.  2 cm repaired laceration base of right thumb, suture line clean  Psychiatric: His behavior is normal.    Data Reviewed Progress notes, labs and abdominal ultrasound  LFTs were normal. US- GB not well seen, multiple stones noted and Ggbw is 0.5cm Assessment    Symptomatic gall stones with chronic cholecystitis.    Plan    Encouraged the use of miralax on a daily basis to reduce constipation.  Laparoscopic Cholecystectomy with Intraoperative Cholangiogram. The procedure, including it's potential risks and complications (including but not limited to infection, bleeding, injury to intra-abdominal organs or bile ducts, bile leak, poor cosmetic result, sepsis and death) were discussed with the patient in detail. Non-operative options, including their inherent risks (acute calculous cholecystitis with possible choledocholithiasis or gallstone pancreatitis, with the risk of ascending cholangitis, sepsis, and death) were discussed as well. The patient expressed and understanding of what we discussed and wishes to proceed with laparoscopic  cholecystectomy. The patient further understands that if it is technically not possible, or it is unsafe to proceed laparoscopically, that I will convert to an open cholecystectomy.      HPI, Physical Exam, Assessment and Plan have been scribed under the direction and in the presence of Kathreen CosierS. G. Princeston Blizzard, MD Dorathy DaftMarsha Hatch, RN I have completed the exam and reviewed the above documentation for accuracy and  completeness.  I agree with the above.  [employer]gallery [employer] has been used and any errors in dictation or transcription are unintentional.  Arvid Marengo G. Evette CristalSankar, M.D., F.A.C.S.  Gerlene BurdockSANKAR,Prynce Jacober G 04/25/2017, 2:21 PM  Patient's surgery [facility].   Nicholes MangoMichele J. Bailey, CMA

## 2017-04-25 NOTE — H&P (View-Only) (Signed)
Patient ID: Darrell Hughes, male   DOB: 11/01/1990, 26 y.o.   MRN: [id number]  Chief Complaint  Patient presents with  . Other    gall stones    HPI Darrell Hughes is a 26 y.o. male.  Here today for evaluation of gallstones referred by Dr Niemeyer. He has been having some constant right upper quadrant pain under his rib area for about 8 months. He feels salads make it worse. He does notice the pain radiating to the back pain as well. He does admit to mucous stools on occasion for about 1-2 months. He noticed that prune juice made it better. He does admit to constipation with some discomfort with a BM. Abdominal ultrasound was 03-11-17. He works for [employer] and had a right hand injury on Tuesday  HPI  Past Medical History:  Diagnosis Date  . Murmur, cardiac     Past Surgical History:  Procedure Laterality Date  . KNEE ARTHROSCOPY      Family History  Problem Relation Age of Onset  . Lung cancer Mother        and colon    Social History Social History   Tobacco Use  . Smoking status: Never Smoker  . Smokeless tobacco: Never Used  Substance Use Topics  . Alcohol use: No  . Drug use: No    No Known Allergies  Current Outpatient Medications  Medication Sig Dispense Refill  . traMADol (ULTRAM) 50 MG tablet Take 1 tablet (50 mg total) by mouth every 6 (six) hours as needed. 20 tablet 0   No current facility-administered medications for this visit.     Review of Systems Review of Systems  Constitutional: Negative.   Respiratory: Positive for cough.   Cardiovascular: Negative.   Gastrointestinal: Positive for abdominal pain and constipation. Negative for diarrhea.    Blood pressure 130/80, pulse 86, resp. rate 12, height 6' (1.829 m), weight 270 lb (122.5 kg).  Physical Exam Physical Exam  Constitutional: He is oriented to person, place, and time. He appears well-developed and well-nourished.  HENT:  Mouth/Throat: Oropharynx is clear and moist.   Eyes: Conjunctivae are normal. No scleral icterus.  Neck: Neck supple.  Cardiovascular: Normal rate, regular rhythm and normal heart sounds.  Pulmonary/Chest: Effort normal and breath sounds normal.  Abdominal: Soft. Normal appearance. There is tenderness in the right upper quadrant. There is negative Murphy's sign.  Lymphadenopathy:    He has no cervical adenopathy.  Neurological: He is alert and oriented to person, place, and time.  Skin: Skin is warm and dry.  2 cm repaired laceration base of right thumb, suture line clean  Psychiatric: His behavior is normal.    Data Reviewed Progress notes, labs and abdominal ultrasound  LFTs were normal. US- GB not well seen, multiple stones noted and Ggbw is 0.5cm Assessment    Symptomatic gall stones with chronic cholecystitis.    Plan    Encouraged the use of miralax on a daily basis to reduce constipation.  Laparoscopic Cholecystectomy with Intraoperative Cholangiogram. The procedure, including it's potential risks and complications (including but not limited to infection, bleeding, injury to intra-abdominal organs or bile ducts, bile leak, poor cosmetic result, sepsis and death) were discussed with the patient in detail. Non-operative options, including their inherent risks (acute calculous cholecystitis with possible choledocholithiasis or gallstone pancreatitis, with the risk of ascending cholangitis, sepsis, and death) were discussed as well. The patient expressed and understanding of what we discussed and wishes to proceed with laparoscopic   cholecystectomy. The patient further understands that if it is technically not possible, or it is unsafe to proceed laparoscopically, that I will convert to an open cholecystectomy.      HPI, Physical Exam, Assessment and Plan have been scribed under the direction and in the presence of Kathreen CosierS. G. Jaxxson Cavanah, MD Dorathy DaftMarsha Hatch, RN I have completed the exam and reviewed the above documentation for accuracy and  completeness.  I agree with the above.  [employer]gallery [employer] has been used and any errors in dictation or transcription are unintentional.  Jorge Retz G. Evette CristalSankar, M.D., F.A.C.S.  Gerlene BurdockSANKAR,Lanee Chain G 04/25/2017, 2:21 PM  Patient's surgery has been [facility] for [facility]30-[facility].   Nicholes MangoMichele J. Bailey, CMA

## 2017-04-26 ENCOUNTER — Other Ambulatory Visit: Payer: Self-pay

## 2017-04-26 ENCOUNTER — Encounter
Admission: RE | Admit: 2017-04-26 | Discharge: 2017-04-26 | Disposition: A | Discharge status: Home / Self Care | Payer: Medicaid Other | Source: Ambulatory Visit | Attending: General Surgery | Admitting: General Surgery

## 2017-04-26 HISTORY — DX: Other specified postprocedural states: Z98.890

## 2017-04-26 HISTORY — DX: Other specified postprocedural states: R11.2

## 2017-04-26 HISTORY — DX: Family history of other specified conditions: Z84.89

## 2017-04-26 NOTE — Patient Instructions (Addendum)
  Your procedure is scheduled ZO:XWRUEAVon:Tuesday Dec. 18 , 2018. Report to Same Day Surgery.  To find out your arrival time please call [phone] between 1PM - 3PM on Monday  .  Remember: Instructions that are not followed completely may result in serious medical risk, up to and including death, or upon the discretion of your surgeon and anesthesiologist your surgery may need to be rescheduled.    _x___ 1. Do not eat food after midnight night prior to surgery.     No gum chewing or hard candies, snacks or breakfast.     May drink the following:      water      Gatorade     clear apple juice      black coffee      or black tea      ____ 2. No Alcohol for 24 hours before or after surgery.   ____ 3. Bring all medications with you on the day of surgery if instructed.    __x__ 4. Notify your doctor if there is any change in your medical condition     (cold, fever, infections).    _____ 5.   Do Not Smoke or use e-cigarettes For 24 Hours Prior to Your Surgery.     Do not use any chewable tobacco products for at least 6 hours prior to  surgery.                  Do not wear jewelry, make-up, hairpins, clips or nail polish.  Do not wear lotions, powders, or perfumes.   Do not shave 48 hours prior to surgery. Men may shave face and neck.  Do not bring valuables to the hospital.    [facility] any belongings or valuables.               Contacts, dentures or bridgework may not be worn into surgery.  Leave your suitcase in the car. After surgery it may be brought to your room.  For patients admitted to the hospital, discharge time is determined by your treatment team.   Patients discharged the day of surgery will not be allowed to drive home.    Please read over the following fact sheets that you were given:   [facility] Preparing [facility]  ____ Take these medicines the morning of surgery with A SIP OF WATER: NONE      ____ Fleet Enema (as directed)   ____  Use CHG Soap as directed on instruction sheet  ____ Use inhalers on the day of surgery and bring to hospital day of surgery  ____ Stop metformin 2 days prior to surgery    ____ Take 1/2 of usual insulin dose the night before surgery and none on the morning of          surgery.   ____ Stop Eliquis/Coumadin/Plavix/aspirin on does not apply.  __x__ Stop Anti-inflammatories such as Advil, Aleve, Ibuprofen, Motrin, Naproxen,    Naprosyn, Goodies powders or aspirin products. OK to take Tylenol.   ____ Stop supplements until after surgery.    ____ Bring C-Pap to the hospital.

## 2017-04-29 MED ORDER — DEXTROSE 5 % IV SOLN
3.0000 g | INTRAVENOUS | Status: AC
  Administered 2017-04-30: 3 g via INTRAVENOUS
  Filled 2017-04-29: qty 3000

## 2017-04-30 ENCOUNTER — Ambulatory Visit: Payer: Medicaid Other | Admitting: Registered Nurse

## 2017-04-30 ENCOUNTER — Encounter
Admission: RE | Disposition: A | Discharge status: Home / Self Care | Payer: Self-pay | Source: Ambulatory Visit | Attending: General Surgery

## 2017-04-30 ENCOUNTER — Ambulatory Visit: Payer: Medicaid Other

## 2017-04-30 ENCOUNTER — Ambulatory Visit
Admission: RE | Admit: 2017-04-30 | Discharge: 2017-04-30 | Disposition: A | Discharge status: Home / Self Care | Payer: Medicaid Other | Source: Ambulatory Visit | Attending: General Surgery | Admitting: General Surgery

## 2017-04-30 ENCOUNTER — Encounter: Payer: Self-pay | Admitting: *Deleted

## 2017-04-30 ENCOUNTER — Other Ambulatory Visit: Payer: Self-pay

## 2017-04-30 DIAGNOSIS — R011 Cardiac murmur, unspecified: Secondary | ICD-10-CM | POA: Insufficient documentation

## 2017-04-30 DIAGNOSIS — K8020 Calculus of gallbladder without cholecystitis without obstruction: Secondary | ICD-10-CM

## 2017-04-30 DIAGNOSIS — K8010 Calculus of gallbladder with chronic cholecystitis without obstruction: Secondary | ICD-10-CM | POA: Diagnosis not present

## 2017-04-30 DIAGNOSIS — K5900 Constipation, unspecified: Secondary | ICD-10-CM | POA: Insufficient documentation

## 2017-04-30 HISTORY — PX: CHOLECYSTECTOMY: SHX55

## 2017-04-30 SURGERY — LAPAROSCOPIC CHOLECYSTECTOMY WITH INTRAOPERATIVE CHOLANGIOGRAM
Anesthesia: General | Wound class: Clean Contaminated

## 2017-04-30 MED ORDER — ACETAMINOPHEN 10 MG/ML IV SOLN
INTRAVENOUS | Status: AC
  Filled 2017-04-30: qty 100

## 2017-04-30 MED ORDER — PROPOFOL 10 MG/ML IV BOLUS
INTRAVENOUS | Status: DC | PRN
  Administered 2017-04-30: 200 mg via INTRAVENOUS

## 2017-04-30 MED ORDER — KETOROLAC TROMETHAMINE 30 MG/ML IJ SOLN
INTRAMUSCULAR | Status: DC | PRN
  Administered 2017-04-30: 30 mg via INTRAVENOUS

## 2017-04-30 MED ORDER — ROCURONIUM BROMIDE 50 MG/5ML IV SOLN
INTRAVENOUS | Status: AC
  Filled 2017-04-30: qty 1

## 2017-04-30 MED ORDER — DEXTROSE 5 % IV SOLN: INTRAVENOUS | Status: DC | PRN

## 2017-04-30 MED ORDER — LACTATED RINGERS IV SOLN
INTRAVENOUS | Status: DC
  Administered 2017-04-30: 08:00:00 via INTRAVENOUS

## 2017-04-30 MED ORDER — FAMOTIDINE 20 MG PO TABS
20.0000 mg | ORAL_TABLET | Freq: Once | ORAL | Status: AC
  Administered 2017-04-30: 20 mg via ORAL

## 2017-04-30 MED ORDER — FAMOTIDINE 20 MG PO TABS
ORAL_TABLET | ORAL | Status: AC
  Administered 2017-04-30: 20 mg via ORAL
  Filled 2017-04-30: qty 1

## 2017-04-30 MED ORDER — OXYCODONE HCL 5 MG PO TABS: 5.0000 mg | ORAL_TABLET | ORAL | 0 refills | Status: DC | PRN

## 2017-04-30 MED ORDER — MIDAZOLAM HCL 2 MG/2ML IJ SOLN
INTRAMUSCULAR | Status: DC | PRN
  Administered 2017-04-30: 2 mg via INTRAVENOUS

## 2017-04-30 MED ORDER — FENTANYL CITRATE (PF) 100 MCG/2ML IJ SOLN
25.0000 ug | INTRAMUSCULAR | Status: DC | PRN
  Administered 2017-04-30 (×3): 50 ug via INTRAVENOUS

## 2017-04-30 MED ORDER — FENTANYL CITRATE (PF) 100 MCG/2ML IJ SOLN
INTRAMUSCULAR | Status: AC
  Filled 2017-04-30: qty 2

## 2017-04-30 MED ORDER — PROPOFOL 10 MG/ML IV BOLUS
INTRAVENOUS | Status: AC
  Filled 2017-04-30: qty 20

## 2017-04-30 MED ORDER — OXYCODONE HCL 5 MG PO TABS
5.0000 mg | ORAL_TABLET | Freq: Once | ORAL | Status: AC | PRN
  Administered 2017-04-30: 5 mg via ORAL

## 2017-04-30 MED ORDER — SODIUM CHLORIDE 0.9 % IJ SOLN
INTRAMUSCULAR | Status: AC
  Filled 2017-04-30: qty 50

## 2017-04-30 MED ORDER — CHLORHEXIDINE GLUCONATE CLOTH 2 % EX PADS: 6.0000 | MEDICATED_PAD | Freq: Once | CUTANEOUS | Status: DC

## 2017-04-30 MED ORDER — FENTANYL CITRATE (PF) 100 MCG/2ML IJ SOLN
INTRAMUSCULAR | Status: AC
  Administered 2017-04-30: 50 ug via INTRAVENOUS
  Filled 2017-04-30: qty 2

## 2017-04-30 MED ORDER — SUGAMMADEX SODIUM 500 MG/5ML IV SOLN
INTRAVENOUS | Status: DC | PRN
  Administered 2017-04-30: 250 mg via INTRAVENOUS

## 2017-04-30 MED ORDER — DEXAMETHASONE SODIUM PHOSPHATE 10 MG/ML IJ SOLN
INTRAMUSCULAR | Status: DC | PRN
  Administered 2017-04-30: 10 mg via INTRAVENOUS

## 2017-04-30 MED ORDER — LACTATED RINGERS IR SOLN
Status: DC | PRN
Start: 2017-04-30 — End: 2017-04-30
  Administered 2017-04-30: 1000 mL

## 2017-04-30 MED ORDER — ONDANSETRON HCL 4 MG/2ML IJ SOLN
INTRAMUSCULAR | Status: AC
  Filled 2017-04-30: qty 2

## 2017-04-30 MED ORDER — OXYCODONE HCL 5 MG/5ML PO SOLN
5.0000 mg | Freq: Once | ORAL | Status: AC | PRN
Start: 2017-04-30 — End: 2017-04-30

## 2017-04-30 MED ORDER — SEVOFLURANE IN SOLN
RESPIRATORY_TRACT | Status: AC
  Filled 2017-04-30: qty 250

## 2017-04-30 MED ORDER — FENTANYL CITRATE (PF) 100 MCG/2ML IJ SOLN
INTRAMUSCULAR | Status: DC | PRN
  Administered 2017-04-30: 100 ug via INTRAVENOUS
  Administered 2017-04-30: 50 ug via INTRAVENOUS
  Administered 2017-04-30 (×2): 25 ug via INTRAVENOUS

## 2017-04-30 MED ORDER — MIDAZOLAM HCL 2 MG/2ML IJ SOLN
INTRAMUSCULAR | Status: AC
  Filled 2017-04-30: qty 2

## 2017-04-30 MED ORDER — LIDOCAINE HCL (PF) 2 % IJ SOLN
INTRAMUSCULAR | Status: AC
  Filled 2017-04-30: qty 10

## 2017-04-30 MED ORDER — OXYCODONE HCL 5 MG PO TABS
ORAL_TABLET | ORAL | Status: AC
  Administered 2017-04-30: 5 mg via ORAL
  Filled 2017-04-30: qty 1

## 2017-04-30 MED ORDER — LIDOCAINE HCL (CARDIAC) 20 MG/ML IV SOLN
INTRAVENOUS | Status: DC | PRN
  Administered 2017-04-30: 60 mg via INTRAVENOUS

## 2017-04-30 MED ORDER — SODIUM CHLORIDE 0.9 % IV SOLN
INTRAVENOUS | Status: DC | PRN
  Administered 2017-04-30: 20 mL

## 2017-04-30 MED ORDER — MEPERIDINE HCL 50 MG/ML IJ SOLN: 6.2500 mg | INTRAMUSCULAR | Status: DC | PRN

## 2017-04-30 MED ORDER — ROCURONIUM BROMIDE 100 MG/10ML IV SOLN
INTRAVENOUS | Status: DC | PRN
  Administered 2017-04-30: 50 mg via INTRAVENOUS
  Administered 2017-04-30: 5 mg via INTRAVENOUS
  Administered 2017-04-30: 10 mg via INTRAVENOUS
  Administered 2017-04-30: 20 mg via INTRAVENOUS

## 2017-04-30 MED ORDER — KETOROLAC TROMETHAMINE 30 MG/ML IJ SOLN
INTRAMUSCULAR | Status: AC
  Filled 2017-04-30: qty 1

## 2017-04-30 MED ORDER — ONDANSETRON HCL 4 MG/2ML IJ SOLN
INTRAMUSCULAR | Status: DC | PRN
  Administered 2017-04-30: 4 mg via INTRAVENOUS

## 2017-04-30 MED ORDER — DEXAMETHASONE SODIUM PHOSPHATE 10 MG/ML IJ SOLN
INTRAMUSCULAR | Status: AC
Start: 2017-04-30 — End: ?
  Filled 2017-04-30: qty 1

## 2017-04-30 MED ORDER — PROMETHAZINE HCL 25 MG/ML IJ SOLN: 6.2500 mg | INTRAMUSCULAR | Status: DC | PRN

## 2017-04-30 MED ORDER — SUGAMMADEX SODIUM 500 MG/5ML IV SOLN
INTRAVENOUS | Status: AC
  Filled 2017-04-30: qty 5

## 2017-04-30 MED ORDER — ACETAMINOPHEN 10 MG/ML IV SOLN
INTRAVENOUS | Status: DC | PRN
  Administered 2017-04-30: 1000 mg via INTRAVENOUS

## 2017-04-30 SURGICAL SUPPLY — 46 items
AGENT HMST MTR 8 SURGIFLO (HEMOSTASIS)
ANCHOR TIS RET SYS 235ML (MISCELLANEOUS) ×1 IMPLANT
APL ESCP 34 STRL LF DISP (HEMOSTASIS)
APPLICATOR SURGIFLO ENDO (HEMOSTASIS) IMPLANT
APPLIER CLIP LOGIC TI 5 (MISCELLANEOUS) ×3 IMPLANT
APR CLP MED LRG 33X5 (MISCELLANEOUS) ×1
BAG SPEC RTRVL LRG 6X4 10 (ENDOMECHANICALS) ×1
BAG TISS RTRVL C235 10X14 (MISCELLANEOUS)
BLADE CLIPPER SURG (BLADE) ×2 IMPLANT
BLADE SURG 11 STRL SS SAFETY (MISCELLANEOUS) ×3 IMPLANT
CANISTER SUCT 1200ML W/VALVE (MISCELLANEOUS) ×3 IMPLANT
CANNULA DILATOR 10 W/SLV (CANNULA) ×2 IMPLANT
CANNULA DILATOR 10MM W/SLV (CANNULA) ×1
CATH CHOLANG 76X19 KUMAR (CATHETERS) ×3 IMPLANT
CHLORAPREP W/TINT 26ML (MISCELLANEOUS) ×3 IMPLANT
CLOSURE WOUND 1/2 X4 (GAUZE/BANDAGES/DRESSINGS) ×1
DEFOGGER SCOPE WARMER CLEARIFY (MISCELLANEOUS) ×3 IMPLANT
DRAPE C-ARM XRAY 36X54 (DRAPES) ×3 IMPLANT
DRAPE INCISE IOBAN 66X45 STRL (DRAPES) ×3 IMPLANT
DRSG TEGADERM 2-3/8X2-3/4 SM (GAUZE/BANDAGES/DRESSINGS) ×12 IMPLANT
DRSG TELFA 4X3 1S NADH ST (GAUZE/BANDAGES/DRESSINGS) ×3 IMPLANT
ELECT REM PT RETURN 9FT ADLT (ELECTROSURGICAL) ×3
ELECTRODE REM PT RTRN 9FT ADLT (ELECTROSURGICAL) ×1 IMPLANT
GLOVE BIO SURGEON STRL SZ7 (GLOVE) ×11 IMPLANT
GOWN STRL REUS W/ TWL LRG LVL3 (GOWN DISPOSABLE) ×3 IMPLANT
GOWN STRL REUS W/TWL LRG LVL3 (GOWN DISPOSABLE) ×9
GRASPER SUT TROCAR 14GX15 (MISCELLANEOUS) ×3 IMPLANT
HEMOSTAT SURGICEL 2X3 (HEMOSTASIS) IMPLANT
IRRIGATION STRYKERFLOW (MISCELLANEOUS) ×1 IMPLANT
IRRIGATOR STRYKERFLOW (MISCELLANEOUS) ×3
IV LACTATED RINGERS 1000ML (IV SOLUTION) ×3 IMPLANT
KIT RM TURNOVER STRD PROC AR (KITS) ×3 IMPLANT
LABEL OR SOLS (LABEL) ×3 IMPLANT
NDL INSUFF ACCESS 14 VERSASTEP (NEEDLE) ×3 IMPLANT
PACK LAP CHOLECYSTECTOMY (MISCELLANEOUS) ×3 IMPLANT
POUCH SPECIMEN RETRIEVAL 10MM (ENDOMECHANICALS) ×2 IMPLANT
SCISSORS METZENBAUM CVD 33 (INSTRUMENTS) ×3 IMPLANT
SLEEVE ENDOPATH XCEL 5M (ENDOMECHANICALS) ×6 IMPLANT
SPOGE SURGIFLO 8M (HEMOSTASIS)
SPONGE SURGIFLO 8M (HEMOSTASIS) IMPLANT
STRIP CLOSURE SKIN 1/2X4 (GAUZE/BANDAGES/DRESSINGS) ×2 IMPLANT
SUT VIC AB 0 SH 27 (SUTURE) ×3 IMPLANT
SUT VIC AB 4-0 FS2 27 (SUTURE) ×3 IMPLANT
SWABSTK COMLB BENZOIN TINCTURE (MISCELLANEOUS) ×3 IMPLANT
TROCAR XCEL NON-BLD 5MMX100MML (ENDOMECHANICALS) ×3 IMPLANT
TUBING INSUFFLATION (TUBING) ×3 IMPLANT

## 2017-04-30 NOTE — Anesthesia Preprocedure Evaluation (Signed)
Anesthesia Evaluation  Patient identified by MRN, date of birth, ID band Patient awake    Reviewed: Allergy & Precautions, NPO status , Patient's Chart, lab work & pertinent test results  History of Anesthesia Complications (+) PONV and history of anesthetic complications  Airway Mallampati: II  TM Distance: >3 FB Neck ROM: Full    Dental no notable dental hx.    Pulmonary neg pulmonary ROS, neg sleep apnea, neg COPD,    breath sounds clear to auscultation- rhonchi (-) wheezing      Cardiovascular Exercise Tolerance: Good (-) hypertension(-) CAD, (-) Past MI and (-) Cardiac Stents  Rhythm:Regular Rate:Normal - Systolic murmurs and - Diastolic murmurs    Neuro/Psych negative neurological ROS  negative psych ROS   GI/Hepatic negative GI ROS, Neg liver ROS,   Endo/Other  negative endocrine ROSneg diabetes  Renal/GU negative Renal ROS     Musculoskeletal negative musculoskeletal ROS (+)   Abdominal (+) + obese,   Peds  Hematology negative hematology ROS (+)   Anesthesia Other Findings Past Medical History: No date: Family history of adverse reaction to anesthesia No date: Murmur, cardiac     Comment:  as an infant, has resolved No date: PONV (postoperative nausea and vomiting)     Comment:  pt may have eaten prior to surgery.'   Reproductive/Obstetrics                             Anesthesia Physical Anesthesia Plan  ASA: II  Anesthesia Plan: General   Post-op Pain Management:    Induction: Intravenous  PONV Risk Score and Plan: 2 and Ondansetron and Dexamethasone  Airway Management Planned: Oral ETT  Additional Equipment:   Intra-op Plan:   Post-operative Plan: Extubation in OR  Informed Consent: I have reviewed the patients History and Physical, chart, labs and discussed the procedure including the risks, benefits and alternatives for the proposed anesthesia with the  patient or authorized representative who has indicated his/her understanding and acceptance.   Dental advisory given  Plan Discussed with: CRNA and Anesthesiologist  Anesthesia Plan Comments:         Anesthesia Quick Evaluation

## 2017-04-30 NOTE — Anesthesia Post-op Follow-up Note (Signed)
Anesthesia QCDR form completed.        

## 2017-04-30 NOTE — Anesthesia Postprocedure Evaluation (Signed)
Anesthesia Post Note  Patient: Lurena Nidanthony Joshua Cheeks  Procedure(s) Performed: LAPAROSCOPIC CHOLECYSTECTOMY WITH INTRAOPERATIVE CHOLANGIOGRAM (N/A )  Patient location during evaluation: PACU Anesthesia Type: General Level of consciousness: awake and alert and oriented Pain management: pain level controlled Vital Signs Assessment: post-procedure vital signs reviewed and stable Respiratory status: spontaneous breathing, nonlabored ventilation and respiratory function stable Cardiovascular status: blood pressure returned to baseline and stable Postop Assessment: no signs of nausea or vomiting Anesthetic complications: no     Last Vitals:  Vitals:   04/30/17 1049 04/30/17 1103  BP:  122/72  Pulse: 70 (!) 58  Resp: 18 16  Temp: 36.5 C (!) 35.9 C  SpO2: 97% 97%    Last Pain:  Vitals:   04/30/17 1103  TempSrc:   PainSc: 6                  Mckay Brandt

## 2017-04-30 NOTE — Transfer of Care (Signed)
Immediate Anesthesia Transfer of Care Note  Patient: Darrell Hughes  Procedure(s) Performed: LAPAROSCOPIC CHOLECYSTECTOMY WITH INTRAOPERATIVE CHOLANGIOGRAM (N/A )  Patient Location: PACU  Anesthesia Type:General  Level of Consciousness: awake, alert  and oriented  Airway & Oxygen Therapy: Patient Spontanous Breathing  Post-op Assessment: Report given to RN and Post -op Vital signs reviewed and stable  Post vital signs: Reviewed and stable  Last Vitals:  Vitals:   04/30/17 1010 04/30/17 1025  BP: (!) 156/96 138/80  Pulse: 79 (!) 57  Resp: 16 19  Temp: (!) 36.4 C   SpO2: 99% 100%    Last Pain:  Vitals:   04/30/17 1025  TempSrc:   PainSc: 10-Worst pain ever         Complications: No apparent anesthesia complications

## 2017-04-30 NOTE — Anesthesia Procedure Notes (Signed)
Procedure Name: Intubation Date/Time: 04/30/2017 8:43 AM Performed by: Hedda Slade, CRNA Pre-anesthesia Checklist: Patient identified, Patient being monitored, Timeout performed, Emergency Drugs available and Suction available Patient Re-evaluated:Patient Re-evaluated prior to induction Oxygen Delivery Method: Circle system utilized Preoxygenation: Pre-oxygenation with 100% oxygen Induction Type: IV induction Ventilation: Mask ventilation without difficulty Laryngoscope Size: Mac and 4 Grade View: Grade I Tube type: Oral Tube size: 7.5 mm Number of attempts: 1 Airway Equipment and Method: Stylet Placement Confirmation: ETT inserted through vocal cords under direct vision,  positive ETCO2 and breath sounds checked- equal and bilateral Secured at: 22 cm Tube secured with: Tape Dental Injury: Teeth and Oropharynx as per pre-operative assessment

## 2017-04-30 NOTE — Interval H&P Note (Signed)
History and Physical Interval Note:  04/30/2017 8:16 AM  Darrell Hughes  has presented today for surgery, with the diagnosis of CHOLELITHIASIS, CHRONIC CHOLECYSTITIS  The various methods of treatment have been discussed with the patient and family. After consideration of risks, benefits and other options for treatment, the patient has consented to  Procedure(s): LAPAROSCOPIC CHOLECYSTECTOMY WITH INTRAOPERATIVE CHOLANGIOGRAM (N/A) as a surgical intervention .  The patient's history has been reviewed, patient examined, no change in status, stable for surgery.  I have reviewed the patient's chart and labs.  Questions were answered to the patient's satisfaction.     SANKAR,SEEPLAPUTHUR G

## 2017-04-30 NOTE — Discharge Instructions (Signed)
AMBULATORY SURGERY  DISCHARGE INSTRUCTIONS   1) The drugs that you were given will stay in your system until tomorrow so for the next 24 hours you should not:  A) Drive an automobile B) Make any legal decisions C) Drink any alcoholic beverage   2) You may resume regular meals tomorrow.  Today it is better to start with liquids and gradually work up to solid foods.  You may eat anything you prefer, but it is better to start with liquids, then soup and crackers, and gradually work up to solid foods.   3) Please notify your doctor immediately if you have any unusual bleeding, trouble breathing, redness and pain at the surgery site, drainage, fever, or pain not relieved by medication.    Additional Instructions:Splint abdominal incisions with a pillow anytime your cough sneeze or move.       Please contact your physician with any problems or Same Day Surgery at [phone], Monday through Friday 6 am to 4 pm, or [place] at [facility] Main number at [phone].

## 2017-04-30 NOTE — Op Note (Signed)
Preop diagnosis: Chronic cholecystitis with cholelithiasis  post op diagnosis: Same  Operation: Laparoscopy cholecystectomy with intraoperative cholangiogram  Surgeon: Kathreen CosierS.  G.  Sankar   Assistant:     Anesthesia: General  Complications: None  EBL: Less than 10 mL  Drains: None  Description: Patient was put to sleep in supine position the operating table and the abdomen prepped and draped sterile field.  Timeout was performed.  Initial port incision was made in the inferior lip of the umbilicus.  Fascia was lifted up and a Veress needle with the InnerDyne sleeve was then positioned in the peritoneal cavity verified of the hanging drop method.  Pneumoperitoneum was obtained on 10 mm port was placed.  Camera was introduced epigastric and 2 lateral 5 mm ports were placed.  There was good visual visualization with no apparent injury to the underlying structures from the initial entry.  Gallbladder was noted to be free of acute changes.  He was mildly distended with mild thickening of the wall and some adhesion in the midportion down to the cystic duct area.  With careful cephalad traction the adhesions were taken down to expose the region of the cystic duct.  Dissection was then carried out and was apparently the patient had a fairly prominent cystic artery located more or less medial and anterior to the cystic duct area in addition a second smaller branch was accompanying this.  Both these were isolated and made sure there were entering the gallbladder.  There were hemoclipped and cut separately.  The cystic duct was fairly narrow this was circumferentially freed.  Kumar clamp and catheter was then positioned and the cholangiogram was completed.  The common bile duct and the proximal hepatic ducts were visualized.  There was a questionable defect in the distal common bile duct but it seemed to flush out on a repeat injection.  The catheter was used to decompress the gallbladder and removed.  Cystic  duct was hemoclipped cup.  It was noted there was a prominent artery coursing along the posterior aspect of the cystic duct area and extending onto the lateral edge of the gallbladder before entering the liver this likely was the right hepatic artery.  To minimize any chance of injury to this dissection was done very carefully as the gallbladder was freed from its bed and then subsequently completely freed.  Gallbladder bed was inspected and the clips were all intact.  No bleeding was noted.  The liver area was then irrigated out and all fluid suctioned out.  With a 5 mm scope in the epigastric port site to the retrieval bag was positioned in the through the umbilical port site and the gallbladder was brought out through this area.  Multiple small stones up to about 6 mm were noted.  The fascial opening of the umbilicus was then closed with 0 Vicryl the placed with the use of a suture passer.  Once again the gallbladder area was inspected and hemostasis was intact and the clips were intact and all remaining fluid suctioned out.  Ports were removed and pneumoperitoneum was released.  Skin incisions were all closed with subcuticular 4-0 Vicryl reinforced with Steri-Strips and tincture benzoin.  Telfa and Tegaderm dressings were placed.  Patient subsequently was extubated and returned to recovery room in stable condition

## 2017-05-01 LAB — SURGICAL PATHOLOGY

## 2017-05-09 ENCOUNTER — Ambulatory Visit (INDEPENDENT_AMBULATORY_CARE_PROVIDER_SITE_OTHER): Payer: Medicaid Other | Admitting: General Surgery

## 2017-05-09 ENCOUNTER — Encounter: Payer: Self-pay | Admitting: General Surgery

## 2017-05-09 VITALS — BP 134/74 | HR 80 | Resp 12 | Ht 72.0 in | Wt 267.0 lb

## 2017-05-09 DIAGNOSIS — K8010 Calculus of gallbladder with chronic cholecystitis without obstruction: Secondary | ICD-10-CM

## 2017-05-09 NOTE — Progress Notes (Signed)
Patient ID: Darrell Nidanthony Joshua Mihalko, male   DOB: 12/23/1990, 26 y.o.   MRN: [id number]  Chief Complaint  Patient presents with  . Follow-up    HPI Darrell Hughes is a 26 y.o. male here today for his post op lap chole done on  04/30/2017. Patient states he is doing well.  One night he had a temperature elevation all of a sudden of a sudden but it resolved overnight without any associated symptoms. HPI  Past Medical History:  Diagnosis Date  . Family history of adverse reaction to anesthesia   . Murmur, cardiac    as an infant, has resolved  . PONV (postoperative nausea and vomiting)    pt may have eaten prior to surgery.    Past Surgical History:  Procedure Laterality Date  . CHOLECYSTECTOMY N/A 04/30/2017   Procedure: LAPAROSCOPIC CHOLECYSTECTOMY WITH INTRAOPERATIVE CHOLANGIOGRAM;  Surgeon: Kieth BrightlySankar, Seeplaputhur G, MD;  Location: [facility] ORS;  Service: General;  Laterality: N/A;  . FOOT SURGERY Right 1996    from glass  . KNEE ARTHROSCOPY      Family History  Problem Relation Age of Onset  . Lung cancer Mother        and colon    Social History Social History   Tobacco Use  . Smoking status: Never Smoker  . Smokeless tobacco: Never Used  Substance Use Topics  . Alcohol use: No  . Drug use: No    Allergies  Allergen Reactions  . Adhesive [Tape] Rash    OK to use paper tape    No current outpatient medications on file.   No current facility-administered medications for this visit.     Review of Systems Review of Systems  Constitutional: Negative.   Respiratory: Negative.   Cardiovascular: Negative.     Blood pressure 134/74, pulse 80, resp. rate 12, height 6' (1.829 m), weight 267 lb (121.1 kg).  Physical Exam Physical Exam  Constitutional: He is oriented to person, place, and time. He appears well-developed and well-nourished.  Cardiovascular: Normal rate, regular rhythm and normal heart sounds.  Neurological: He is alert and oriented to person, place,  and time.  Skin: Skin is warm and dry.  Port sites are clean and intact Abdomen is soft with good bowel sounds Lungs are clear  Data Reviewed Op note reviewed  Path -chronic cholecystitis and cholelithiasis  Intraoperative cholangiogram was normal assessment Postop laparoscopy and cholecystectomy.  Patient is doing well    Plan  Patient to return to work on 05/11/2017. Return here as needed. The patient is aware to call back for any questions or concerns.      HPI, Physical Exam, Assessment and Plan have been scribed under the direction and in the presence of Kathreen CosierS. G. Sankar, MD  Ples SpecterJessica Qualls, CMA  I have completed the exam and reviewed the above documentation for accuracy and completeness.  I agree with the above.  [employer] has been used and any errors in dictation or transcription are unintentional.  Seeplaputhur G. Evette CristalSankar, M.D., F.A.C.S.    Gerlene BurdockSANKAR,SEEPLAPUTHUR G 05/09/2017, 2:47 PM

## 2017-05-09 NOTE — Patient Instructions (Signed)
Patient to return to work on 05/11/2017. Return as needed. The patient is aware to call back for any questions or concerns.

## 2018-02-26 ENCOUNTER — Other Ambulatory Visit: Payer: Self-pay

## 2018-02-26 ENCOUNTER — Emergency Department
Admission: EM | Admit: 2018-02-26 | Discharge: 2018-02-26 | Disposition: A | Discharge status: Home / Self Care | Payer: Medicaid Other | Attending: Emergency Medicine | Admitting: Emergency Medicine

## 2018-02-26 ENCOUNTER — Emergency Department: Payer: Medicaid Other

## 2018-02-26 ENCOUNTER — Encounter: Payer: Self-pay | Admitting: Emergency Medicine

## 2018-02-26 DIAGNOSIS — R109 Unspecified abdominal pain: Secondary | ICD-10-CM | POA: Diagnosis not present

## 2018-02-26 DIAGNOSIS — R197 Diarrhea, unspecified: Secondary | ICD-10-CM | POA: Insufficient documentation

## 2018-02-26 DIAGNOSIS — R112 Nausea with vomiting, unspecified: Secondary | ICD-10-CM | POA: Diagnosis not present

## 2018-02-26 LAB — CBC
HEMATOCRIT: 44.5 % (ref 39.0–52.0)
HEMOGLOBIN: 14.8 g/dL (ref 13.0–17.0)
MCH: 28.8 pg (ref 26.0–34.0)
MCHC: 33.3 g/dL (ref 30.0–36.0)
MCV: 86.6 fL (ref 80.0–100.0)
Platelets: 221 10*3/uL (ref 150–400)
RBC: 5.14 MIL/uL (ref 4.22–5.81)
RDW: 13.7 % (ref 11.5–15.5)
WBC: 10.9 10*3/uL — AB (ref 4.0–10.5)
nRBC: 0 % (ref 0.0–0.2)

## 2018-02-26 LAB — COMPREHENSIVE METABOLIC PANEL
ALBUMIN: 4.7 g/dL (ref 3.5–5.0)
ALT: 28 U/L (ref 0–44)
AST: 28 U/L (ref 15–41)
Alkaline Phosphatase: 67 U/L (ref 38–126)
Anion gap: 10 (ref 5–15)
BUN: 13 mg/dL (ref 6–20)
CHLORIDE: 104 mmol/L (ref 98–111)
CO2: 26 mmol/L (ref 22–32)
Calcium: 9.3 mg/dL (ref 8.9–10.3)
Creatinine, Ser: 1.16 mg/dL (ref 0.61–1.24)
GFR calc Af Amer: >60 mL/min (ref >60)
GLUCOSE: 86 mg/dL (ref 70–99)
POTASSIUM: 3.6 mmol/L (ref 3.5–5.1)
SODIUM: 140 mmol/L (ref 135–145)
Total Bilirubin: 0.6 mg/dL (ref 0.3–1.2)
Total Protein: 7.8 g/dL (ref 6.5–8.1)

## 2018-02-26 LAB — URINALYSIS, COMPLETE (UACMP) WITH MICROSCOPIC
BACTERIA UA: NONE SEEN
BILIRUBIN URINE: NEGATIVE
Glucose, UA: NEGATIVE mg/dL
HGB URINE DIPSTICK: NEGATIVE
Ketones, ur: NEGATIVE mg/dL
LEUKOCYTES UA: NEGATIVE
NITRITE: NEGATIVE
PROTEIN: NEGATIVE mg/dL
Specific Gravity, Urine: 1.031 — ABNORMAL HIGH (ref 1.005–1.030)
pH: 5 (ref 5.0–8.0)

## 2018-02-26 LAB — LIPASE, BLOOD: LIPASE: 25 U/L (ref 11–51)

## 2018-02-26 MED ORDER — MORPHINE SULFATE (PF) 4 MG/ML IV SOLN
4.0000 mg | Freq: Once | INTRAVENOUS | Status: AC
  Administered 2018-02-26: 4 mg via INTRAVENOUS
  Filled 2018-02-26: qty 1

## 2018-02-26 MED ORDER — IOPAMIDOL (ISOVUE-300) INJECTION 61%
100.0000 mL | Freq: Once | INTRAVENOUS | Status: AC | PRN
  Administered 2018-02-26: 100 mL via INTRAVENOUS
  Filled 2018-02-26: qty 100

## 2018-02-26 MED ORDER — SODIUM CHLORIDE 0.9 % IV BOLUS
1000.0000 mL | Freq: Once | INTRAVENOUS | Status: AC
  Administered 2018-02-26: 1000 mL via INTRAVENOUS

## 2018-02-26 MED ORDER — ONDANSETRON HCL 4 MG/2ML IJ SOLN
4.0000 mg | Freq: Once | INTRAMUSCULAR | Status: AC
  Administered 2018-02-26: 4 mg via INTRAVENOUS
  Filled 2018-02-26: qty 2

## 2018-02-26 MED ORDER — DICYCLOMINE HCL 20 MG PO TABS: 20.0000 mg | ORAL_TABLET | Freq: Three times a day (TID) | ORAL | 0 refills | Status: DC | PRN

## 2018-02-26 MED ORDER — ONDANSETRON HCL 4 MG PO TABS: 4.0000 mg | ORAL_TABLET | Freq: Every day | ORAL | 0 refills | Status: DC | PRN

## 2018-02-26 NOTE — ED Notes (Signed)
First Nurse Note:  Patient presents to the ED from [facility] with right upper quadrant and epigastric pain that has been sharp since 3am yesterday with nausea, vomiting and diarrhea.  Patient has had intermittent abdominal pain x 2 years.  Patient had gallbladder removed last year.

## 2018-02-26 NOTE — ED Triage Notes (Signed)
Says stomach pain for 2 years.  Had gallbladder out last year, but pain continues.  This time it started at 3am yesterday.

## 2018-02-26 NOTE — ED Provider Notes (Signed)
[facility] Emergency Department Provider Note ____________________________________________   First MD Initiated Contact with Patient 02/26/18 1914     (approximate)  I have reviewed the triage vital signs and the nursing notes.   HISTORY  Chief Complaint Abdominal Pain  HPI Darrell Hughes is a 27 y.o. male with a history of chronic abdominal pain status post cholecystectomy 1 year ago who was presented to the emergency department right-sided abdominal pain radiating to his left upper quadrant.  He was seen at [facility] practice earlier today and sent to the emergency department for concerns for "colitis or diverticulitis."  Patient says that he has had nausea vomiting diarrhea over the past 2 to 3 days.  Says the pain is an 8 out of 10 at this time.  Does not report radiation through to his back or any urinary complaints.  Says that he is being seen by Dr. Norma Fredrickson in [facility] gastro[facility]ology and is planning to have scopes performed.  Patient says that he has been tested for H. pylori in the past which was negative.  Is on omeprazole at this time.  Also takes hyacosamin.   Past Medical History:  Diagnosis Date  . Family history of adverse reaction to anesthesia   . Murmur, cardiac    as an infant, has resolved  . PONV (postoperative nausea and vomiting)    pt may have eaten prior to surgery.    There are no active problems to display for this patient.   Past Surgical History:  Procedure Laterality Date  . CHOLECYSTECTOMY N/A 04/30/2017   Procedure: LAPAROSCOPIC CHOLECYSTECTOMY WITH INTRAOPERATIVE CHOLANGIOGRAM;  Surgeon: Kieth Brightly, MD;  Location: [facility] ORS;  Service: General;  Laterality: N/A;  . FOOT SURGERY Right 1996    from glass  . KNEE ARTHROSCOPY      Prior to Admission medications   Not on File    Allergies Adhesive [tape]  Family History  Problem Relation Age of Onset  . Lung cancer Mother        and  colon    Social History Social History   Tobacco Use  . Smoking status: Never Smoker  . Smokeless tobacco: Never Used  Substance Use Topics  . Alcohol use: No  . Drug use: No    Review of Systems  Constitutional: No fever/chills Eyes: No visual changes. ENT: No sore throat. Cardiovascular: Denies chest pain. Respiratory: Denies shortness of breath. Gastrointestinal:  No constipation. Genitourinary: Negative for dysuria. Musculoskeletal: Negative for back pain. Skin: Negative for rash. Neurological: Negative for headaches, focal weakness or numbness.   ____________________________________________   PHYSICAL EXAM:  VITAL SIGNS: ED Triage Vitals  Enc Vitals Group     BP 02/26/18 1811 (!) 147/65     Pulse Rate 02/26/18 1811 63     Resp 02/26/18 1811 16     Temp 02/26/18 1811 98.7 F (37.1 C)     Temp Source 02/26/18 1811 Oral     SpO2 02/26/18 1811 100 %     Weight 02/26/18 1812 280 lb (127 kg)     Height 02/26/18 1812 6' (1.829 m)     Head Circumference --      Peak Flow --      Pain Score 02/26/18 1812 8     Pain Loc --      Pain Edu? --      Excl. in GC? --     Constitutional: Alert and oriented.in no acute distress. Eyes: Conjunctivae are  normal.  Head: Atraumatic. Nose: No congestion/rhinnorhea. Mouth/Throat: Mucous membranes are moist.  Neck: No stridor.   Cardiovascular: Normal rate, regular rhythm. Grossly normal heart sounds.   Respiratory: Normal respiratory effort.  No retractions. Lungs CTAB. Gastrointestinal: Soft with right-sided abdominal pain as well as right lower quadrant tenderness.  Minimal right upper quadrant tenderness to palpation with a negative Murphy sign.  Minimal left upper quadrant tenderness to palpation.  No left lower quadrant tenderness. Musculoskeletal: No lower extremity tenderness nor edema.  No joint effusions. Neurologic:  Normal speech and language. No gross focal neurologic deficits are appreciated. Skin:  Skin is  warm, dry and intact. No rash noted. Psychiatric: Mood and affect are normal. Speech and behavior are normal.  ____________________________________________   LABS (all labs ordered are listed, but only abnormal results are displayed)  Labs Reviewed  CBC - Abnormal; Notable for the following components:      Result Value   WBC 10.9 (*)    All other components within normal limits  URINALYSIS, COMPLETE (UACMP) WITH MICROSCOPIC - Abnormal; Notable for the following components:   Color, Urine YELLOW (*)    APPearance CLEAR (*)    Specific Gravity, Urine 1.031 (*)    All other components within normal limits  LIPASE, BLOOD  COMPREHENSIVE METABOLIC PANEL   ____________________________________________  EKG   ____________________________________________  RADIOLOGY  CAT scan without any acute pathology. ____________________________________________   PROCEDURES  Procedure(s) performed:   Procedures  Critical Care performed:   ____________________________________________   INITIAL IMPRESSION / ASSESSMENT AND PLAN / ED COURSE  Pertinent labs & imaging results that were available during my care of the patient were reviewed by me and considered in my medical decision making (see chart for details).  Differential diagnosis includes, but is not limited to, acute appendicitis, renal colic, testicular torsion, urinary tract infection/pyelonephritis, prostatitis,  epididymitis, diverticulitis, small bowel obstruction or ileus, colitis, abdominal aortic aneurysm, gastroenteritis, hernia, etc. As part of my medical decision making, I reviewed the following data within the electronic MEDICAL RECORD NUMBER Notes from prior ED visits  ----------------------------------------- 8:21 PM on 02/26/2018 -----------------------------------------  Patient with reassuring imaging.  No vomiting here in the emergency department and appears calm at this time.  Patient will be discharged with Zofran  as well as Bentyl.  We will follow-up with his primary care doctor as well as GI.  Updated as to the lab as well as imaging results.  Likely ongoing chronic issues versus viral infection. ____________________________________________   FINAL CLINICAL IMPRESSION(S) / ED DIAGNOSES  Abdominal pain with nausea vomiting and diarrhea.  NEW MEDICATIONS STARTED DURING THIS VISIT:  New Prescriptions   No medications on file     Note:  This document was prepared using Dragon voice recognition software and may include unintentional dictation errors.     Myrna Blazer, MD 02/26/18 2021

## 2018-03-19 ENCOUNTER — Encounter (HOSPITAL_COMMUNITY): Payer: Self-pay | Admitting: Emergency Medicine

## 2018-03-19 ENCOUNTER — Emergency Department (HOSPITAL_COMMUNITY)
Admission: EM | Admit: 2018-03-19 | Discharge: 2018-03-19 | Disposition: A | Discharge status: Home / Self Care | Payer: Medicaid Other | Attending: Emergency Medicine | Admitting: Emergency Medicine

## 2018-03-19 ENCOUNTER — Ambulatory Visit (HOSPITAL_COMMUNITY): Payer: Medicaid Other

## 2018-03-19 DIAGNOSIS — R110 Nausea: Secondary | ICD-10-CM

## 2018-03-19 DIAGNOSIS — R112 Nausea with vomiting, unspecified: Secondary | ICD-10-CM | POA: Diagnosis not present

## 2018-03-19 DIAGNOSIS — Z79899 Other long term (current) drug therapy: Secondary | ICD-10-CM | POA: Diagnosis not present

## 2018-03-19 DIAGNOSIS — R1011 Right upper quadrant pain: Secondary | ICD-10-CM | POA: Diagnosis not present

## 2018-03-19 LAB — CBC WITH DIFFERENTIAL/PLATELET
ABS IMMATURE GRANULOCYTES: 0.04 10*3/uL (ref 0.00–0.07)
BASOS ABS: 0 10*3/uL (ref 0.0–0.1)
Basophils Relative: 0 %
EOS PCT: 3 %
Eosinophils Absolute: 0.2 10*3/uL (ref 0.0–0.5)
HEMATOCRIT: 44 % (ref 39.0–52.0)
HEMOGLOBIN: 14 g/dL (ref 13.0–17.0)
Immature Granulocytes: 0 %
LYMPHS ABS: 3.3 10*3/uL (ref 0.7–4.0)
LYMPHS PCT: 36 %
MCH: 27.8 pg (ref 26.0–34.0)
MCHC: 31.8 g/dL (ref 30.0–36.0)
MCV: 87.5 fL (ref 80.0–100.0)
MONO ABS: 0.6 10*3/uL (ref 0.1–1.0)
Monocytes Relative: 6 %
NEUTROS ABS: 4.9 10*3/uL (ref 1.7–7.7)
Neutrophils Relative %: 55 %
Platelets: 220 10*3/uL (ref 150–400)
RBC: 5.03 MIL/uL (ref 4.22–5.81)
RDW: 13.6 % (ref 11.5–15.5)
WBC: 9.1 10*3/uL (ref 4.0–10.5)
nRBC: 0 % (ref 0.0–0.2)

## 2018-03-19 LAB — COMPREHENSIVE METABOLIC PANEL
ALK PHOS: 64 U/L (ref 38–126)
ALT: 14 U/L (ref 0–44)
AST: 21 U/L (ref 15–41)
Albumin: 4.1 g/dL (ref 3.5–5.0)
Anion gap: 8 (ref 5–15)
BILIRUBIN TOTAL: 0.8 mg/dL (ref 0.3–1.2)
BUN: 9 mg/dL (ref 6–20)
CALCIUM: 9.1 mg/dL (ref 8.9–10.3)
CO2: 25 mmol/L (ref 22–32)
Chloride: 106 mmol/L (ref 98–111)
Creatinine, Ser: 1.07 mg/dL (ref 0.61–1.24)
GFR calc Af Amer: >60 mL/min (ref >60)
GFR calc non Af Amer: >60 mL/min (ref >60)
GLUCOSE: 92 mg/dL (ref 70–99)
Potassium: 3.6 mmol/L (ref 3.5–5.1)
SODIUM: 139 mmol/L (ref 135–145)
TOTAL PROTEIN: 6.8 g/dL (ref 6.5–8.1)

## 2018-03-19 LAB — LIPASE, BLOOD: Lipase: 27 U/L (ref 11–51)

## 2018-03-19 LAB — URINALYSIS, ROUTINE W REFLEX MICROSCOPIC
Bilirubin Urine: NEGATIVE
Glucose, UA: NEGATIVE mg/dL
Hgb urine dipstick: NEGATIVE
Ketones, ur: NEGATIVE mg/dL
Leukocytes, UA: NEGATIVE
NITRITE: NEGATIVE
PH: 5 (ref 5.0–8.0)
Protein, ur: NEGATIVE mg/dL
SPECIFIC GRAVITY, URINE: 1.03 (ref 1.005–1.030)

## 2018-03-19 LAB — ETHANOL: Alcohol, Ethyl (B): <10 mg/dL (ref <10)

## 2018-03-19 MED ORDER — TRAMADOL HCL 50 MG PO TABS: 50.0000 mg | ORAL_TABLET | Freq: Four times a day (QID) | ORAL | 0 refills | Status: DC | PRN

## 2018-03-19 MED ORDER — ONDANSETRON HCL 4 MG/2ML IJ SOLN
4.0000 mg | Freq: Once | INTRAMUSCULAR | Status: AC
  Administered 2018-03-19: 4 mg via INTRAVENOUS
  Filled 2018-03-19: qty 2

## 2018-03-19 MED ORDER — SODIUM CHLORIDE 0.9 % IV BOLUS
1000.0000 mL | Freq: Once | INTRAVENOUS | Status: AC
  Administered 2018-03-19: 1000 mL via INTRAVENOUS

## 2018-03-19 MED ORDER — HYDROMORPHONE HCL 1 MG/ML IJ SOLN
1.0000 mg | Freq: Once | INTRAMUSCULAR | Status: AC
  Administered 2018-03-19: 1 mg via INTRAVENOUS
  Filled 2018-03-19: qty 1

## 2018-03-19 MED ORDER — ONDANSETRON 4 MG PO TBDP: 4.0000 mg | ORAL_TABLET | Freq: Three times a day (TID) | ORAL | 0 refills | Status: DC | PRN

## 2018-03-19 NOTE — ED Notes (Signed)
Patient able to ambulate independently  

## 2018-03-19 NOTE — Discharge Instructions (Signed)
As discussed, your evaluation today has been largely reassuring.  But, it is important that you monitor your condition carefully, and do not hesitate to return to the ED if you develop new, or concerning changes in your condition. ? ?Otherwise, please follow-up with your physician for appropriate ongoing care. ? ?

## 2018-03-19 NOTE — ED Provider Notes (Signed)
[facility] EMERGENCY DEPARTMENT Provider Note   CSN: [id number] Arrival date & time: 03/19/18  1541     History   Chief Complaint Chief Complaint  Patient presents with  . Abdominal Pain    HPI Darrell Hughes is a 27 y.o. male.  HPI Patient presents with concern of ongoing right abdominal pain, nausea, vomiting. Patient notes that over the past months, he has had episodes of pain, and today's had a more severe event than usual, with sharp severe pain throughout the right upper quadrant. There is associated nausea, vomiting.  Notably, the patient has a history of cholecystectomy last year, and his pain began after that. In the interval the patient has seen gastroenterology x2, primary care, and spoke with his surgeon, has had studies performed including CT scan, apparently without any formal diagnosis of his etiology for pain, nausea, vomiting.  No new fever, chills, confusion, disorientation, left-sided pain. It is unclear if he has taken any pain medication.  Past Medical History:  Diagnosis Date  . Family history of adverse reaction to anesthesia   . Murmur, cardiac    as an infant, has resolved  . PONV (postoperative nausea and vomiting)    pt may have eaten prior to surgery.    There are no active problems to display for this patient.   Past Surgical History:  Procedure Laterality Date  . CHOLECYSTECTOMY N/A 04/30/2017   Procedure: LAPAROSCOPIC CHOLECYSTECTOMY WITH INTRAOPERATIVE CHOLANGIOGRAM;  Surgeon: Kieth Brightly, MD;  Location: [facility] ORS;  Service: General;  Laterality: N/A;  . FOOT SURGERY Right 1996    from glass  . KNEE ARTHROSCOPY          Home Medications    Prior to Admission medications   Medication Sig Start Date End Date Taking? Authorizing Provider  dicyclomine (BENTYL) 20 MG tablet Take 1 tablet (20 mg total) by mouth 3 (three) times daily as needed for spasms. 02/26/18 02/26/19  Myrna Blazer, MD   ondansetron (ZOFRAN) 4 MG tablet Take 1 tablet (4 mg total) by mouth daily as needed. 02/26/18   Schaevitz, Myra Rude, MD    Family History Family History  Problem Relation Age of Onset  . Lung cancer Mother        and colon    Social History Social History   Tobacco Use  . Smoking status: Never Smoker  . Smokeless tobacco: Never Used  Substance Use Topics  . Alcohol use: No  . Drug use: No     Allergies   Adhesive [tape]   Review of Systems Review of Systems  Constitutional:       Per HPI, otherwise negative  HENT:       Per HPI, otherwise negative  Respiratory:       Per HPI, otherwise negative  Cardiovascular:       Per HPI, otherwise negative  Gastrointestinal: Positive for abdominal pain, nausea and vomiting.  Endocrine:       Negative aside from HPI  Genitourinary:       Neg aside from HPI   Musculoskeletal:       Per HPI, otherwise negative  Skin: Negative.   Neurological: Negative for syncope.     Physical Exam Updated Vital Signs BP (!) 126/91 (BP Location: Right Arm)   Pulse 66   Temp 98.3 F (36.8 C) (Oral)   Resp 18   Ht 6' (1.829 m)   Wt 127.9 kg   SpO2 99%   BMI 38.25  kg/m   Physical Exam  Constitutional: He is oriented to person, place, and time. He appears well-developed. No distress.  HENT:  Head: Normocephalic and atraumatic.  Eyes: Conjunctivae and EOM are normal.  Cardiovascular: Normal rate and regular rhythm.  Pulmonary/Chest: Effort normal. No stridor. No respiratory distress.  Abdominal: He exhibits no distension. There is tenderness in the right upper quadrant.  Musculoskeletal: He exhibits no edema.  Neurological: He is alert and oriented to person, place, and time.  Skin: Skin is warm and dry.  Psychiatric: He has a normal mood and affect.  Nursing note and vitals reviewed.    ED Treatments / Results  Labs (all labs ordered are listed, but only abnormal results are displayed) Labs Reviewed  COMPREHENSIVE  METABOLIC PANEL  ETHANOL  LIPASE, BLOOD  CBC WITH DIFFERENTIAL/PLATELET  URINALYSIS, ROUTINE W REFLEX MICROSCOPIC    EKG None  Radiology US Abdomen Complete  Result Date: 03/19/2018 CLINICAL DATA:  Right upper quadrant pain with nausea and vomiting EXAM: ABDOMEN ULTRASOUND COMPLETE COMPARISON:  CT abdomen pelvis 02/26/2018 FINDINGS: Gallbladder: Surgically absent Common bile duct: Diameter: 4 mm Liver: Mildly echogenic liver parenchyma without focal lesion. Portal vein is patent on color Doppler imaging with normal direction of blood flow towards the liver. IVC: No abnormality visualized. Pancreas: Visualized portion unremarkable. Spleen: Size and appearance within normal limits. Right Kidney: Length: 10.5 cm. Echogenicity within normal limits. No mass or hydronephrosis visualized. Left Kidney: Length: 11.1 cm. Echogenicity within normal limits. No mass or hydronephrosis visualized. Abdominal aorta: No aneurysm visualized. Other findings: None. IMPRESSION: Postop cholecystectomy without biliary dilatation Mild fatty infiltration liver. Electronically Signed   By: Marlan Palau M.D.   On: 03/19/2018 18:20    Procedures Procedures (including critical care time)  Medications Ordered in ED Medications  sodium chloride 0.9 % bolus 1,000 mL (1,000 mLs Intravenous New Bag/Given 03/19/18 1627)  HYDROmorphone (DILAUDID) injection 1 mg (1 mg Intravenous Given 03/19/18 1629)  ondansetron (ZOFRAN) injection 4 mg (4 mg Intravenous Given 03/19/18 1628)     Initial Impression / Assessment and Plan / ED Course  I have reviewed the triage vital signs and the nursing notes.  Pertinent labs & imaging results that were available during my care of the patient were reviewed by me and considered in my medical decision making (see chart for details).    After initial evaluation I reviewed the patient's chart and documentation from visit to our affiliated facility last month, with CT scan unremarkable. 6:47  PM Patient in no distress, awake, alert. This young male presents with ongoing right-sided pain, nausea.  Here he is awake, alert, with a non-peritoneal, tender abdomen. Work-up ultrasound reassuring, labs reassuring, vital signs reassuring peer Patient was provided additional analgesia will follow-up with 1 of our local gastroenterology teams.  Final Clinical Impressions(s) / ED Diagnoses  Abdominal pain Nausea and vomiting   Gerhard Munch, MD 03/19/18 [phone]

## 2018-03-19 NOTE — ED Triage Notes (Signed)
Pt presents to ED For assessment of RUQ pain since his cholecystectomy in December 2018.  States he has been seeing a PCP who has tried multiple attempts to ease his nausea, vomiting and diarrhea.  Patient states he now found out this PCP has been sued in the past for malpractice.  States he went for a second opinion, and has been started on new medicines by a GI doctor 1 month ago.  Patient states he had to call out of work today for his symptoms.

## 2018-04-14 IMAGING — US US ABDOMEN LIMITED
1 series · 14 of 25 positions shown · non-contrast
Comparison: None.

CLINICAL DATA: Pain for 6 months

EXAM:
ULTRASOUND ABDOMEN LIMITED RIGHT UPPER QUADRANT

[Series 1: us abdomen limited · 0.26mm/px · 14 of 54 slices shown]
[im 1/54]
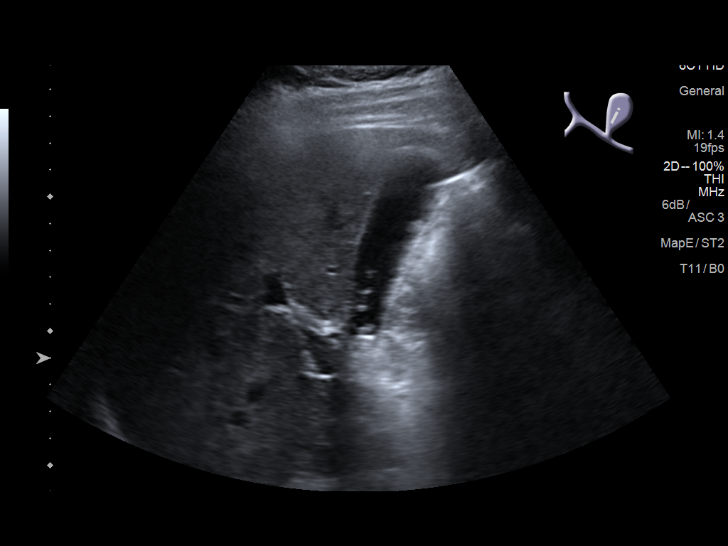
[im 5/54]
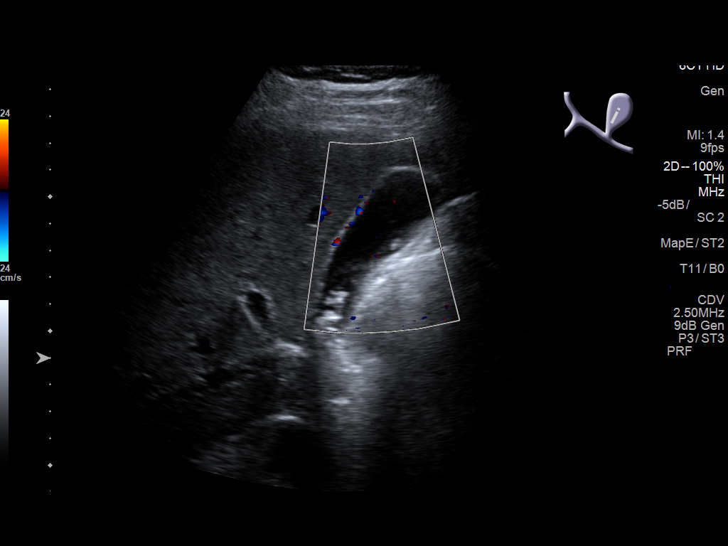
[im 9/54]
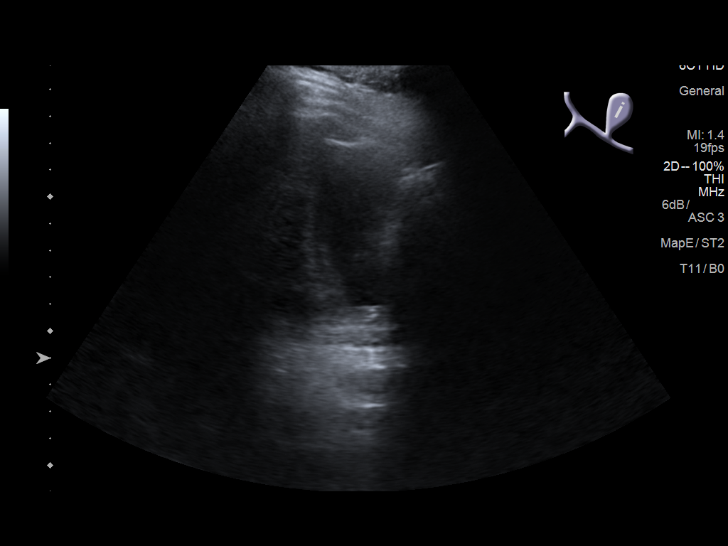
[im 14/54]
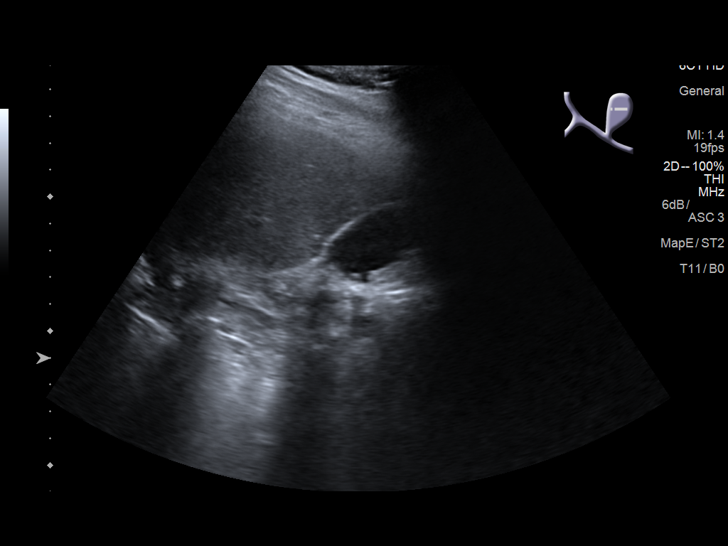
[im 18/54]
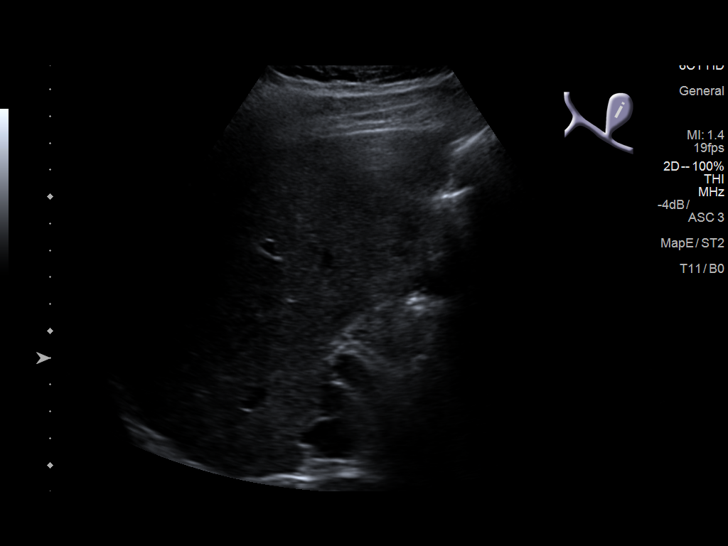
[im 20/54]
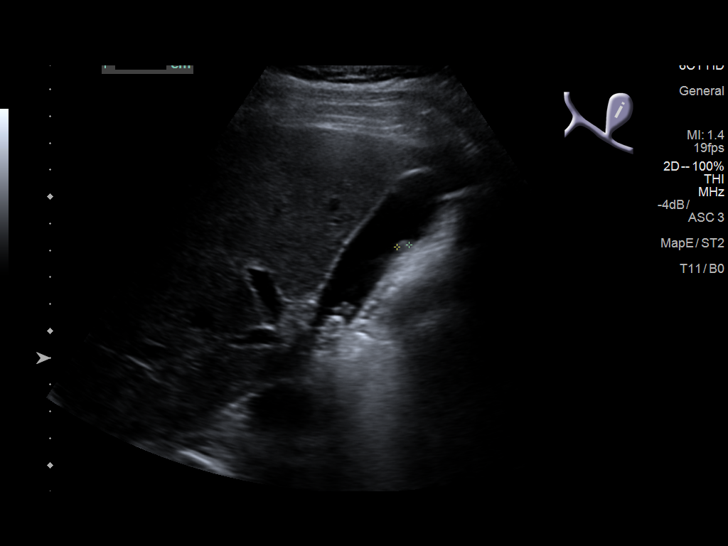
[im 25/54]
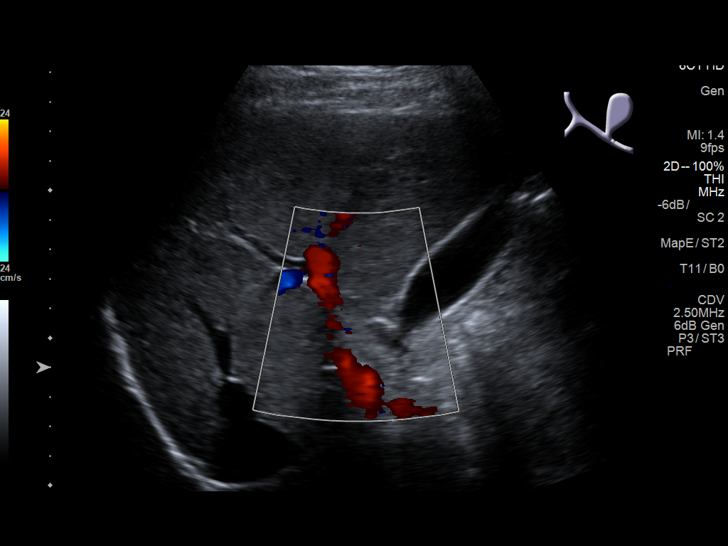
[im 29/54]
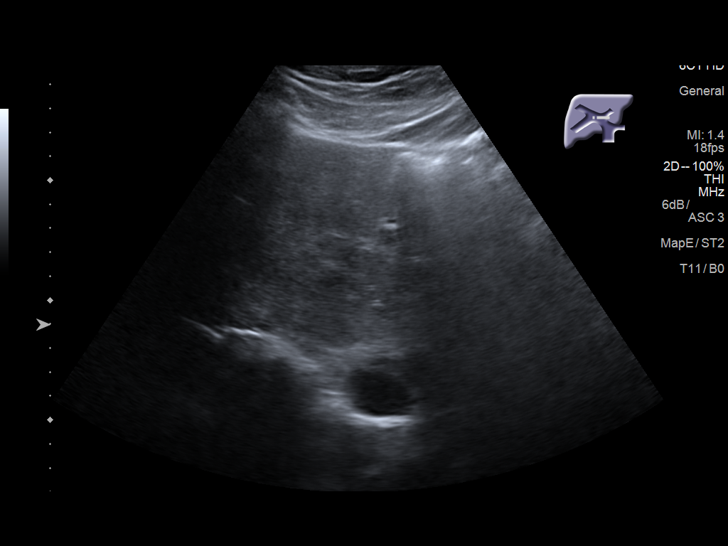
[im 34/54]
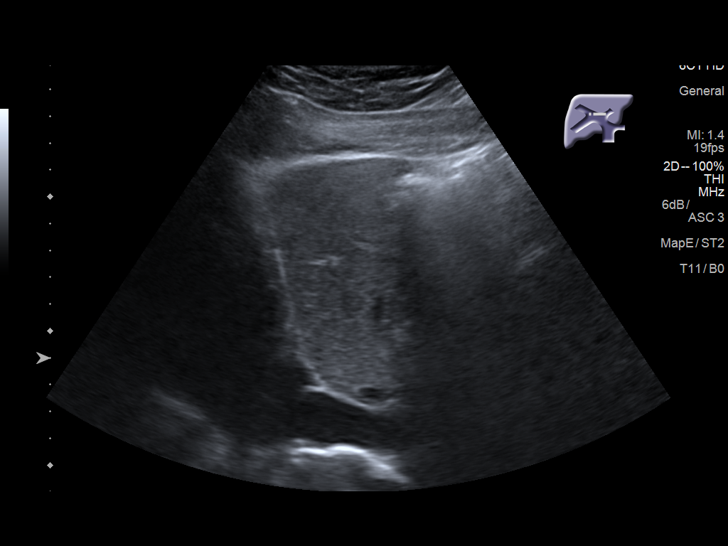
[im 36/54]
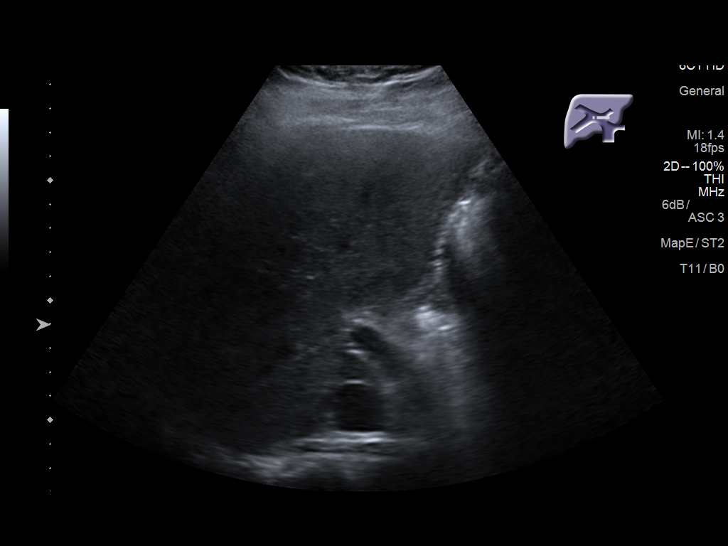
[im 40/54]
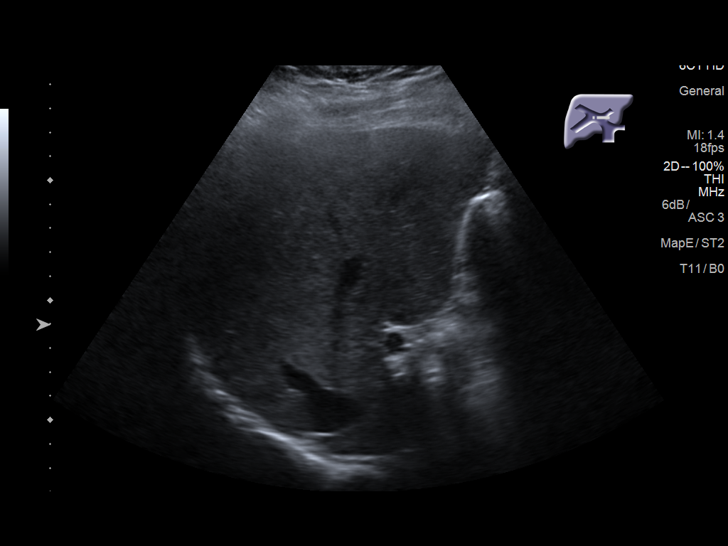
[im 45/54]
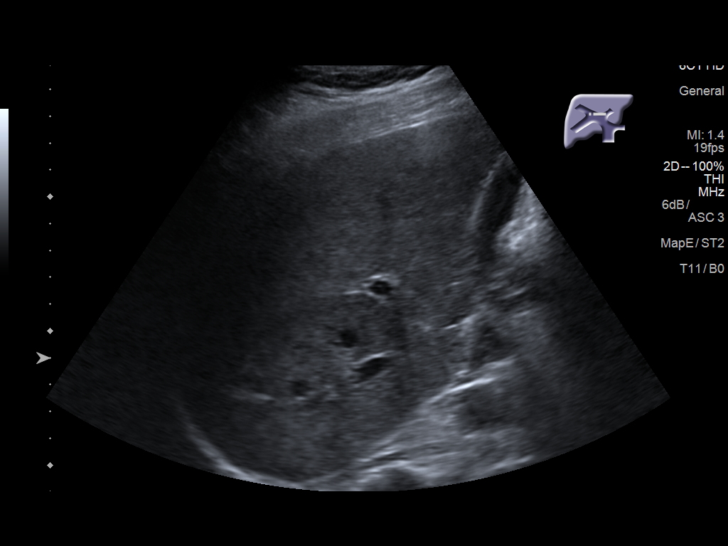
[im 49/54]
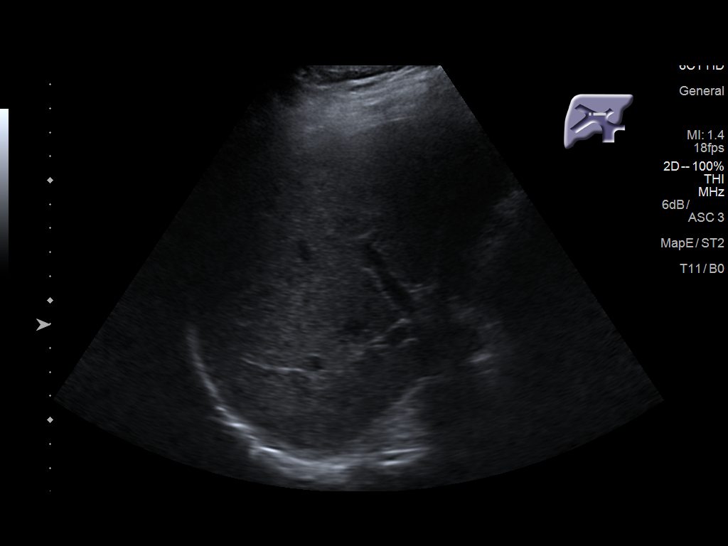
[im 54/54]
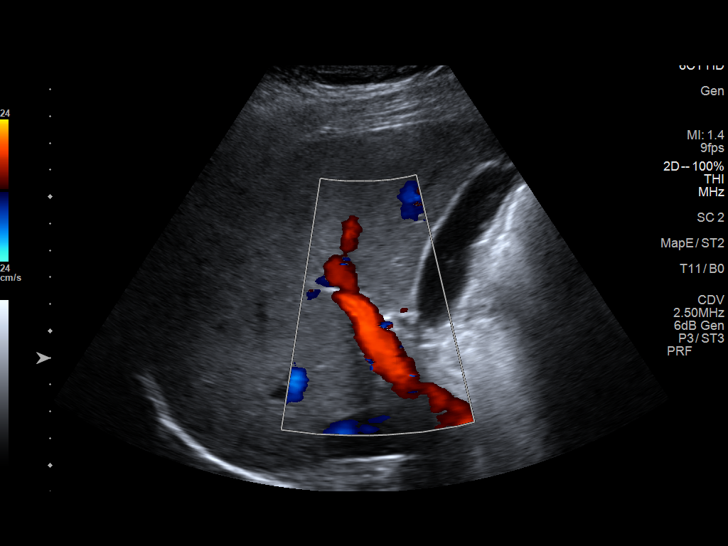

[14 of 25 positions shown; findings below may reference images not displayed]

FINDINGS: Gallbladder:

Multiple stones measuring up to 8 mm. Small gallbladder polyps
measuring up to 5 mm. Normal wall thickness. Negative sonographic
Murphy.

Common bile duct:

Diameter: 2.8 mm

Liver:

No focal lesion identified. Within normal limits in parenchymal
echogenicity. Portal vein is patent on color Doppler imaging with
normal direction of blood flow towards the liver.
IMPRESSION: 1. Cholelithiasis and small gallbladder polyps. No evidence for
acute cholecystitis or biliary dilatation

## 2018-06-11 ENCOUNTER — Other Ambulatory Visit: Payer: Self-pay | Admitting: Gastroenterology

## 2018-06-11 DIAGNOSIS — R1011 Right upper quadrant pain: Secondary | ICD-10-CM

## 2018-06-11 DIAGNOSIS — R112 Nausea with vomiting, unspecified: Secondary | ICD-10-CM

## 2018-06-19 ENCOUNTER — Ambulatory Visit
Admission: RE | Admit: 2018-06-19 | Discharge: 2018-06-19 | Disposition: A | Discharge status: Home / Self Care | Payer: Medicaid Other | Source: Ambulatory Visit | Attending: Gastroenterology | Admitting: Gastroenterology

## 2018-06-19 DIAGNOSIS — R1011 Right upper quadrant pain: Secondary | ICD-10-CM | POA: Diagnosis present

## 2018-06-19 DIAGNOSIS — R112 Nausea with vomiting, unspecified: Secondary | ICD-10-CM | POA: Diagnosis not present

## 2018-06-19 MED ORDER — TECHNETIUM TC 99M SULFUR COLLOID
2.7030 | Freq: Once | INTRAVENOUS | Status: AC | PRN
  Administered 2018-06-19: 2.703 via ORAL

## 2018-07-20 ENCOUNTER — Emergency Department
Admission: EM | Admit: 2018-07-20 | Discharge: 2018-07-20 | Disposition: A | Discharge status: Home / Self Care | Payer: Medicaid Other | Attending: Emergency Medicine | Admitting: Emergency Medicine

## 2018-07-20 ENCOUNTER — Other Ambulatory Visit: Payer: Self-pay

## 2018-07-20 DIAGNOSIS — Z5321 Procedure and treatment not carried out due to patient leaving prior to being seen by health care provider: Secondary | ICD-10-CM | POA: Diagnosis not present

## 2018-07-20 DIAGNOSIS — R109 Unspecified abdominal pain: Secondary | ICD-10-CM | POA: Insufficient documentation

## 2018-07-20 LAB — COMPREHENSIVE METABOLIC PANEL
ALT: 16 U/L (ref 0–44)
AST: 18 U/L (ref 15–41)
Albumin: 4.6 g/dL (ref 3.5–5.0)
Alkaline Phosphatase: 59 U/L (ref 38–126)
Anion gap: 6 (ref 5–15)
BUN: 13 mg/dL (ref 6–20)
CALCIUM: 8.8 mg/dL — AB (ref 8.9–10.3)
CHLORIDE: 111 mmol/L (ref 98–111)
CO2: 24 mmol/L (ref 22–32)
CREATININE: 1.04 mg/dL (ref 0.61–1.24)
Glucose, Bld: 96 mg/dL (ref 70–99)
Potassium: 4.1 mmol/L (ref 3.5–5.1)
Sodium: 141 mmol/L (ref 135–145)
TOTAL PROTEIN: 7.2 g/dL (ref 6.5–8.1)
Total Bilirubin: 0.6 mg/dL (ref 0.3–1.2)

## 2018-07-20 LAB — CBC
HCT: 42.6 % (ref 39.0–52.0)
Hemoglobin: 13.8 g/dL (ref 13.0–17.0)
MCH: 28.8 pg (ref 26.0–34.0)
MCHC: 32.4 g/dL (ref 30.0–36.0)
MCV: 88.8 fL (ref 80.0–100.0)
NRBC: 0 % (ref 0.0–0.2)
PLATELETS: 204 10*3/uL (ref 150–400)
RBC: 4.8 MIL/uL (ref 4.22–5.81)
RDW: 13.6 % (ref 11.5–15.5)
WBC: 9.6 10*3/uL (ref 4.0–10.5)

## 2018-07-20 LAB — LIPASE, BLOOD: LIPASE: 60 U/L — AB (ref 11–51)

## 2018-07-20 NOTE — ED Triage Notes (Signed)
Pt states nausea, R upper abd and L lower abd pain x few days. States fever. No tylenol or motrin. Afebrile on arrival. Denies vomiting. States 2 episodes of diarrhea. No gallbladder.

## 2018-07-20 NOTE — ED Notes (Signed)
Patient called several times over several minutes and hasn't responded.

## 2018-07-21 ENCOUNTER — Telehealth: Payer: Self-pay | Admitting: Emergency Medicine

## 2018-07-21 NOTE — Telephone Encounter (Addendum)
Called patient due to lwot to inquire about condition and follow up plans. No answer andno voicemail  Person called back in response to my call.  She says patient is not there right now.  She says he is still sick.   I told her that I was just checking on him and he is welcome to return at any time.

## 2018-07-30 ENCOUNTER — Emergency Department
Admission: EM | Admit: 2018-07-30 | Discharge: 2018-07-30 | Disposition: A | Discharge status: Home / Self Care | Payer: Medicaid Other | Attending: Emergency Medicine | Admitting: Emergency Medicine

## 2018-07-30 ENCOUNTER — Other Ambulatory Visit: Payer: Self-pay

## 2018-07-30 ENCOUNTER — Encounter: Payer: Self-pay | Admitting: Emergency Medicine

## 2018-07-30 DIAGNOSIS — R1011 Right upper quadrant pain: Secondary | ICD-10-CM | POA: Diagnosis present

## 2018-07-30 DIAGNOSIS — Z79899 Other long term (current) drug therapy: Secondary | ICD-10-CM | POA: Diagnosis not present

## 2018-07-30 DIAGNOSIS — K2900 Acute gastritis without bleeding: Secondary | ICD-10-CM | POA: Diagnosis not present

## 2018-07-30 LAB — URINALYSIS, COMPLETE (UACMP) WITH MICROSCOPIC
BILIRUBIN URINE: NEGATIVE
Bacteria, UA: NONE SEEN
GLUCOSE, UA: NEGATIVE mg/dL
Hgb urine dipstick: NEGATIVE
KETONES UR: NEGATIVE mg/dL
LEUKOCYTE UA: NEGATIVE
NITRITE: NEGATIVE
PH: 5 (ref 5.0–8.0)
Protein, ur: NEGATIVE mg/dL
Specific Gravity, Urine: 1.024 (ref 1.005–1.030)

## 2018-07-30 LAB — COMPREHENSIVE METABOLIC PANEL
ALK PHOS: 61 U/L (ref 38–126)
ALT: 14 U/L (ref 0–44)
ANION GAP: 8 (ref 5–15)
AST: 18 U/L (ref 15–41)
Albumin: 4.6 g/dL (ref 3.5–5.0)
BILIRUBIN TOTAL: 0.6 mg/dL (ref 0.3–1.2)
BUN: 9 mg/dL (ref 6–20)
CO2: 24 mmol/L (ref 22–32)
Calcium: 9.3 mg/dL (ref 8.9–10.3)
Chloride: 108 mmol/L (ref 98–111)
Creatinine, Ser: 0.92 mg/dL (ref 0.61–1.24)
Glucose, Bld: 110 mg/dL — ABNORMAL HIGH (ref 70–99)
Potassium: 4 mmol/L (ref 3.5–5.1)
SODIUM: 140 mmol/L (ref 135–145)
TOTAL PROTEIN: 7.8 g/dL (ref 6.5–8.1)

## 2018-07-30 LAB — CBC
HCT: 43.5 % (ref 39.0–52.0)
Hemoglobin: 14.2 g/dL (ref 13.0–17.0)
MCH: 28.9 pg (ref 26.0–34.0)
MCHC: 32.6 g/dL (ref 30.0–36.0)
MCV: 88.4 fL (ref 80.0–100.0)
NRBC: 0 % (ref 0.0–0.2)
PLATELETS: 242 10*3/uL (ref 150–400)
RBC: 4.92 MIL/uL (ref 4.22–5.81)
RDW: 13.9 % (ref 11.5–15.5)
WBC: 9.4 10*3/uL (ref 4.0–10.5)

## 2018-07-30 LAB — LIPASE, BLOOD: Lipase: 36 U/L (ref 11–51)

## 2018-07-30 MED ORDER — ALUM & MAG HYDROXIDE-SIMETH 200-200-20 MG/5ML PO SUSP
30.0000 mL | Freq: Once | ORAL | Status: AC
  Administered 2018-07-30: 30 mL via ORAL
  Filled 2018-07-30: qty 30

## 2018-07-30 MED ORDER — SUCRALFATE 1 G PO TABS: 1.0000 g | ORAL_TABLET | Freq: Four times a day (QID) | ORAL | 0 refills | Status: DC

## 2018-07-30 MED ORDER — LIDOCAINE VISCOUS HCL 2 % MT SOLN
15.0000 mL | Freq: Once | OROMUCOSAL | Status: AC
  Administered 2018-07-30: 15 mL via ORAL
  Filled 2018-07-30: qty 15

## 2018-07-30 MED ORDER — PANTOPRAZOLE SODIUM 20 MG PO TBEC: 20.0000 mg | DELAYED_RELEASE_TABLET | Freq: Every day | ORAL | 1 refills | Status: DC

## 2018-07-30 NOTE — ED Notes (Signed)
Pt still reports pain after medication. MD notified.

## 2018-07-30 NOTE — ED Provider Notes (Signed)
[facility] Emergency Department Provider Note   ____________________________________________    I have reviewed the triage vital signs and the nursing notes.   HISTORY  Chief Complaint Abdominal Pain     HPI Darrell Hughes is a 28 y.o. male who presents with complaints of right upper quadrant abdominal pain.  Patient reports over the last several days he has had burning right upper quadrant pain.  He has a history of a cholecystectomy.  He reports that he was diagnosed with H. pylori and did take antibiotics and has been doing better.  He reports this pain is further to the right than typical for him.  Denies nausea or vomiting.  Normal stools.  Past Medical History:  Diagnosis Date  . Family history of adverse reaction to anesthesia   . Murmur, cardiac    as an infant, has resolved  . PONV (postoperative nausea and vomiting)    pt may have eaten prior to surgery.    There are no active problems to display for this patient.   Past Surgical History:  Procedure Laterality Date  . CHOLECYSTECTOMY N/A 04/30/2017   Procedure: LAPAROSCOPIC CHOLECYSTECTOMY WITH INTRAOPERATIVE CHOLANGIOGRAM;  Surgeon: Kieth Brightly, MD;  Location: [facility] ORS;  Service: General;  Laterality: N/A;  . FOOT SURGERY Right 1996    from glass  . KNEE ARTHROSCOPY      Prior to Admission medications   Medication Sig Start Date End Date Taking? Authorizing Provider  colestipol (COLESTID) 1 g tablet Take 2 g by mouth 2 (two) times daily. 02/20/18 02/20/19 Yes [provider]  hydrocortisone (PROCTOSOL HC) 2.5 % rectal cream Place 1 application rectally daily. 02/12/18  Yes [provider]  hyoscyamine (LEVSIN, ANASPAZ) 0.125 MG tablet Take 0.125 mg by mouth every 4 (four) hours as needed for cramping. 07/03/18  Yes [provider]  Nitroglycerin 0.4 % OINT Place 1 application rectally 2 (two) times daily. 06/24/18  Yes [provider]  omeprazole (PRILOSEC) 40 MG capsule Take 40 mg by mouth 2 (two) times daily. 02/20/18  Yes [provider]  sulfaSALAzine (AZULFIDINE) 500 MG tablet Take 1,000 mg by mouth 3 (three) times daily. 02/12/18  Yes [provider]  dicyclomine (BENTYL) 20 MG tablet Take 1 tablet (20 mg total) by mouth 3 (three) times daily as needed for spasms. 02/26/18 02/26/19  Schaevitz, Myra Rude, MD  metoCLOPramide (REGLAN) 5 MG tablet Take 5 mg by mouth 2 (two) times daily. 07/03/18   [provider]  ondansetron (ZOFRAN ODT) 4 MG disintegrating tablet Take 1 tablet (4 mg total) by mouth every 8 (eight) hours as needed for nausea or vomiting. 03/19/18   Gerhard Munch, MD  ondansetron (ZOFRAN) 4 MG tablet Take 1 tablet (4 mg total) by mouth daily as needed. 02/26/18   Myrna Blazer, MD  ondansetron (ZOFRAN) 8 MG tablet Take 8 mg by mouth 4 (four) times daily as needed for nausea/vomiting. 05/30/18   [provider]  pantoprazole (PROTONIX) 20 MG tablet Take 1 tablet (20 mg total) by mouth daily. 07/30/18 07/30/19  Jene Every, MD  PROAIR HFA 108 [phone] Base) MCG/ACT inhaler Inhale 2 puffs into the lungs every 4 (four) hours as needed for wheezing. 03/17/18   [provider]  sucralfate (CARAFATE) 1 g tablet Take 1 tablet (1 g total) by mouth 4 (four) times daily for 10 days. 07/30/18 08/09/18  Jene Every, MD  tiZANidine (ZANAFLEX) 4 MG tablet Take 4 mg by  mouth 3 (three) times daily as needed for muscle spasms. 03/05/18   [provider]  traMADol (ULTRAM) 50 MG tablet Take 1 tablet (50 mg total) by mouth every 6 (six) hours as needed. 03/19/18   Gerhard Munch, MD     Allergies Adhesive [tape]  Family History  Problem Relation Age of Onset  . Lung cancer Mother        and colon    Social History Social History   Tobacco Use  . Smoking status: Never Smoker  . Smokeless tobacco: Never Used  Substance Use Topics  . Alcohol use: No  .  Drug use: No    Review of Systems  Constitutional: No fever/chills Eyes: No visual changes.  ENT: No sore throat. Cardiovascular: Denies chest pain. Respiratory: Denies shortness of breath. Gastrointestinal: As above.   Genitourinary: Negative for dysuria. Musculoskeletal: Negative for back pain. Skin: Negative for rash. Neurological: Negative for headaches   ____________________________________________   PHYSICAL EXAM:  VITAL SIGNS: ED Triage Vitals  Enc Vitals Group     BP 07/30/18 1308 124/75     Pulse Rate 07/30/18 1308 66     Resp 07/30/18 1308 16     Temp 07/30/18 1308 99.2 F (37.3 C)     Temp Source 07/30/18 1308 Oral     SpO2 07/30/18 1308 100 %     Weight 07/30/18 1309 122.4 kg (269 lb 13.5 oz)     Height 07/30/18 1309 1.829 m (6')     Head Circumference --      Peak Flow --      Pain Score 07/30/18 1308 8     Pain Loc --      Pain Edu? --      Excl. in GC? --     Constitutional: Alert and oriented. No acute distress. Pleasant and interactive  Nose: No congestion/rhinnorhea. Mouth/Throat: Mucous membranes are moist.    Cardiovascular: Normal rate, regular rhythm. Grossly normal heart sounds.  Good peripheral circulation. Respiratory: Normal respiratory effort.  No retractions. Lungs CTAB. Gastrointestinal: Soft and nontender. No distention.  No CVA tenderness.  Reassuring exam   Musculoskeletal:   Warm and well perfused Neurologic:  Normal speech and language. No gross focal neurologic deficits are appreciated.  Skin:  Skin is warm, dry and intact. No rash noted. Psychiatric: Mood and affect are normal. Speech and behavior are normal.  ____________________________________________   LABS (all labs ordered are listed, but only abnormal results are displayed)  Labs Reviewed  COMPREHENSIVE METABOLIC PANEL - Abnormal; Notable for the following components:      Result Value   Glucose, Bld 110 (*)    All other components within normal limits   URINALYSIS, COMPLETE (UACMP) WITH MICROSCOPIC - Abnormal; Notable for the following components:   Color, Urine YELLOW (*)    APPearance CLEAR (*)    All other components within normal limits  LIPASE, BLOOD  CBC   ____________________________________________  EKG  None ____________________________________________  RADIOLOGY  None ____________________________________________   PROCEDURES  Procedure(s) performed: No  Procedures   Critical Care performed: No ____________________________________________   INITIAL IMPRESSION / ASSESSMENT AND PLAN / ED COURSE  Pertinent labs & imaging results that were available during my care of the patient were reviewed by me and considered in my medical decision making (see chart for details).  Patient with upper abdominal pain as described above.  Differential includes gastritis, pancreatitis, PUD.  We will treat with GI cocktail to see if he has any improvement  in his pain from this.  Lab work thus far is quite reassuring.  Patient has significant provement after GI cocktail consistent with a diagnosis of gastritis versus PUD.  I have asked the patient to follow-up closely with his physician, will start Protonix and Carafate, return precautions discussed    ____________________________________________   FINAL CLINICAL IMPRESSION(S) / ED DIAGNOSES  Final diagnoses:  Acute gastritis without hemorrhage, unspecified gastritis type        Note:  This document was prepared using Dragon voice recognition software and may include unintentional dictation errors.   Jene Every, MD 07/30/18 [phone]

## 2018-07-30 NOTE — ED Triage Notes (Signed)
Pt to ED from home c/o RUQ pain radiating to epigastric going on for a week but got worse today.  Nausea, vomiting, diarrhea with 1 episode emesis last night and diarrhea today.  Denies fevers.  Had gallbladder removed in 2018.

## 2018-09-11 ENCOUNTER — Telehealth: Payer: Self-pay

## 2018-09-11 NOTE — Telephone Encounter (Signed)
I contacted the pt's wife Theora Gianotti and was able to update allergies, meds and PMH.

## 2018-09-15 ENCOUNTER — Ambulatory Visit (INDEPENDENT_AMBULATORY_CARE_PROVIDER_SITE_OTHER): Payer: Medicaid Other | Admitting: Neurology

## 2018-09-15 ENCOUNTER — Telehealth: Payer: Self-pay | Admitting: Neurology

## 2018-09-15 ENCOUNTER — Encounter: Payer: Self-pay | Admitting: Neurology

## 2018-09-15 ENCOUNTER — Other Ambulatory Visit: Payer: Self-pay

## 2018-09-15 DIAGNOSIS — G441 Vascular headache, not elsewhere classified: Secondary | ICD-10-CM

## 2018-09-15 DIAGNOSIS — G43419 Hemiplegic migraine, intractable, without status migrainosus: Secondary | ICD-10-CM

## 2018-09-15 DIAGNOSIS — G43409 Hemiplegic migraine, not intractable, without status migrainosus: Secondary | ICD-10-CM

## 2018-09-15 HISTORY — DX: Hemiplegic migraine, not intractable, without status migrainosus: G43.409

## 2018-09-15 MED ORDER — PROPRANOLOL HCL 20 MG PO TABS: ORAL_TABLET | ORAL | 3 refills | Status: DC

## 2018-09-15 NOTE — Telephone Encounter (Signed)
Medicaid order sent to GI. They will obtain the auth and reach out to the pt to schedule.  °
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
## 2018-09-17 ENCOUNTER — Telehealth: Payer: Self-pay | Admitting: Neurology

## 2018-09-17 NOTE — Telephone Encounter (Signed)
LVMx2 advising pt to call back and schedule a 3 month f/u with NP

## 2018-09-18 ENCOUNTER — Telehealth: Payer: Self-pay | Admitting: Neurology

## 2018-09-18 MED ORDER — TOPIRAMATE 25 MG PO TABS: ORAL_TABLET | ORAL | 3 refills | Status: DC

## 2018-09-18 NOTE — Telephone Encounter (Signed)
I called the patient.  There is no answer, I will call back later.

## 2018-09-18 NOTE — Telephone Encounter (Signed)
Pt is asking that when Dr Anne Hahn calls back that he calls [phone]

## 2018-09-18 NOTE — Addendum Note (Signed)
Addended by: York Spaniel on: 09/18/2018 04:43 PM   Modules accepted: Orders

## 2018-09-18 NOTE — Telephone Encounter (Signed)
Pt called in and stated he is currently taking Propranolol HCl 20 MG  And it isnt helping he currently has a migraine and he is having pain in his left eye

## 2018-09-18 NOTE — Telephone Encounter (Signed)
I called the patient.  The patient indicates that the propranolol is worsening his headaches.  We will stop the drug and start Topamax.  I have sent in a prescription.

## 2018-09-25 ENCOUNTER — Ambulatory Visit
Admission: RE | Admit: 2018-09-25 | Discharge: 2018-09-25 | Disposition: A | Discharge status: Home / Self Care | Payer: Medicaid Other | Source: Ambulatory Visit | Attending: Neurology | Admitting: Neurology

## 2018-09-25 ENCOUNTER — Other Ambulatory Visit: Payer: Self-pay

## 2018-09-25 ENCOUNTER — Telehealth: Payer: Self-pay | Admitting: Neurology

## 2018-09-25 DIAGNOSIS — G441 Vascular headache, not elsewhere classified: Secondary | ICD-10-CM

## 2018-09-25 DIAGNOSIS — G43419 Hemiplegic migraine, intractable, without status migrainosus: Secondary | ICD-10-CM

## 2018-09-25 NOTE — Telephone Encounter (Signed)
I called the patient, talk with his wife.  MRI of the brain is relatively unremarkable, there are occasional punctate white matter changes that are fully consistent with the diagnosis of migraine.  We will continue treating his headache with Topamax, he will call if he cannot tolerate the medication or if the medication is not effective.   MRI brain 09/25/18:  IMPRESSION: This MRI of the brain without contrast shows the following: 1.   Scattered small round T2/flair hyperintense foci in the subcortical and deep white matter.  This is a nonspecific finding and most likely represents either mild early chronic microvascular ischemic changes or the sequela of migraine headaches, prior inflammation or trauma.   2.   Mild left maxillary chronic sinusitis.   3.   There are no acute findings.

## 2018-11-01 ENCOUNTER — Emergency Department
Admission: EM | Admit: 2018-11-01 | Discharge: 2018-11-02 | Disposition: A | Discharge status: Home / Self Care | Payer: Medicaid Other | Attending: Emergency Medicine | Admitting: Emergency Medicine

## 2018-11-01 ENCOUNTER — Emergency Department: Payer: Medicaid Other

## 2018-11-01 ENCOUNTER — Other Ambulatory Visit: Payer: Self-pay

## 2018-11-01 DIAGNOSIS — Z79899 Other long term (current) drug therapy: Secondary | ICD-10-CM | POA: Insufficient documentation

## 2018-11-01 DIAGNOSIS — R079 Chest pain, unspecified: Secondary | ICD-10-CM | POA: Diagnosis present

## 2018-11-01 DIAGNOSIS — R202 Paresthesia of skin: Secondary | ICD-10-CM | POA: Diagnosis not present

## 2018-11-01 LAB — CBC
HCT: 45.7 % (ref 39.0–52.0)
Hemoglobin: 14.9 g/dL (ref 13.0–17.0)
MCH: 28.5 pg (ref 26.0–34.0)
MCHC: 32.6 g/dL (ref 30.0–36.0)
MCV: 87.5 fL (ref 80.0–100.0)
Platelets: 238 10*3/uL (ref 150–400)
RBC: 5.22 MIL/uL (ref 4.22–5.81)
RDW: 13.9 % (ref 11.5–15.5)
WBC: 10 10*3/uL (ref 4.0–10.5)
nRBC: 0 % (ref 0.0–0.2)

## 2018-11-01 LAB — BASIC METABOLIC PANEL
Anion gap: 12 (ref 5–15)
BUN: 15 mg/dL (ref 6–20)
CO2: 18 mmol/L — ABNORMAL LOW (ref 22–32)
Calcium: 9.3 mg/dL (ref 8.9–10.3)
Chloride: 107 mmol/L (ref 98–111)
Creatinine, Ser: 0.98 mg/dL (ref 0.61–1.24)
GFR calc Af Amer: >60 mL/min (ref >60)
GFR calc non Af Amer: >60 mL/min (ref >60)
Glucose, Bld: 92 mg/dL (ref 70–99)
Potassium: 4.5 mmol/L (ref 3.5–5.1)
Sodium: 137 mmol/L (ref 135–145)

## 2018-11-01 LAB — TROPONIN I: Troponin I: <0.03 ng/mL (ref <0.03)

## 2018-11-01 MED ORDER — SODIUM CHLORIDE 0.9% FLUSH: 3.0000 mL | Freq: Once | INTRAVENOUS | Status: DC

## 2018-11-01 NOTE — ED Notes (Signed)
Discussed with Dr. Jacqualine Code, no new orders besides CP protocol.
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
## 2018-11-01 NOTE — ED Notes (Signed)
Pt states numb all over and believes he is having a reaction to his topamax and would like his medication switched.  Pt states he tried to contact his pcp but when they didn't call back he came to er.

## 2018-11-01 NOTE — ED Provider Notes (Signed)
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 year old.  He is concerned about the possibility of multiple sclerosis, also possibly transverse myelitis.  After discussing the case in detail, he recommended MR imaging of the brain and entire spine with and without contrast both to rule out emergent issues as well as to further evaluate the possibility of MS or other progressive neurological conditions.  I have put in the orders and stressed to the patient that these are long studies and he must hold absolutely still.   [CF]  Sun Nov 02, 2018  0338 Dr. Toney Reil called me with the negative and reassuring results from all of the MRIs.  He said there is no evidence of MS, tumor, transverse myelitis, etc.  No indication of any particular explanation for the patient's paresthesias.  I went over the results with the patient and strongly encouraged him to follow-up with his neurologist and to encourage the neurologist to look up the reports in the system, information to his she he should have access.  I gave my usual  customary return precautions and he understands and agrees with the plan.   [CF]    Clinical Course User Index [CF] Hinda Kehr, MD     ____________________________________________  FINAL CLINICAL IMPRESSION(S) / ED DIAGNOSES  Final diagnoses:  Paresthesia     MEDICATIONS GIVEN DURING THIS VISIT:  Medications  sodium chloride flush (NS) 0.9 % injection 3 mL (has no administration in time range)  gadobutrol (GADAVIST) 1 MMOL/ML injection 10 mL (10 mLs Intravenous Contrast Given 11/02/18 0252)     ED Discharge Orders    None       Note:  This document was prepared using Dragon voice recognition software and may include unintentional dictation errors.   Hinda Kehr, MD 11/02/18 [phone]
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
## 2018-11-02 ENCOUNTER — Emergency Department: Payer: Medicaid Other

## 2018-11-02 MED ORDER — GADOBUTROL 1 MMOL/ML IV SOLN
10.0000 mL | Freq: Once | INTRAVENOUS | Status: AC | PRN
  Administered 2018-11-02: 10 mL via INTRAVENOUS

## 2018-11-02 NOTE — ED Notes (Signed)
Pt transported to MRI via wheelchair by EDT Kadijah.

## 2018-11-02 NOTE — Discharge Instructions (Signed)
As we discussed, we performed a very extensive work-up tonight including MRIs of your brain, cervical, thoracic, and lumbar spine.  Your lab work and all of your imaging was reassuring with no evidence of any acute or emergent medical condition to explain your numbness and tingling (paresthesias).  Please follow-up with Dr. Jannifer Franklin, the neurologist you saw previously, and be sure to let him know that you came to the emergency department and had a lot of MRIs performed.  He may be able to use this information to help guide additional evaluation and treatment.  Continue taking your medications for now, follow-up with your doctor at the next available opportunity, and return to the emergency department if you develop new or worsening symptoms.

## 2018-11-02 NOTE — ED Notes (Signed)
Pt remains in MRI 

## 2018-11-02 NOTE — ED Notes (Signed)
Report received on pt. Pt awaiting MRI

## 2018-11-02 NOTE — ED Notes (Signed)
Pt returned from MRI °

## 2019-03-03 ENCOUNTER — Other Ambulatory Visit: Payer: Self-pay | Admitting: Gastroenterology

## 2019-03-03 DIAGNOSIS — R1084 Generalized abdominal pain: Secondary | ICD-10-CM

## 2019-03-03 DIAGNOSIS — R112 Nausea with vomiting, unspecified: Secondary | ICD-10-CM

## 2019-03-09 ENCOUNTER — Ambulatory Visit: Admission: RE | Admit: 2019-03-09 | Payer: Medicaid Other | Source: Ambulatory Visit

## 2019-03-11 ENCOUNTER — Ambulatory Visit
Admission: RE | Admit: 2019-03-11 | Discharge: 2019-03-11 | Disposition: A | Discharge status: Home / Self Care | Payer: Medicaid Other | Source: Ambulatory Visit | Attending: Gastroenterology | Admitting: Gastroenterology

## 2019-03-11 ENCOUNTER — Other Ambulatory Visit: Payer: Self-pay

## 2019-03-11 DIAGNOSIS — R112 Nausea with vomiting, unspecified: Secondary | ICD-10-CM | POA: Insufficient documentation

## 2019-03-11 DIAGNOSIS — R1084 Generalized abdominal pain: Secondary | ICD-10-CM | POA: Diagnosis not present

## 2019-04-23 IMAGING — US US ABDOMEN COMPLETE
1 series · 14 of 25 positions shown · non-contrast
Comparison: CT abdomen pelvis 02/26/2018

CLINICAL DATA: Right upper quadrant pain with nausea and vomiting

EXAM:
ABDOMEN ULTRASOUND COMPLETE

[Series 1: us abdomen complete · 0.26mm/px · 14 of 49 slices shown]
[im 1/49]
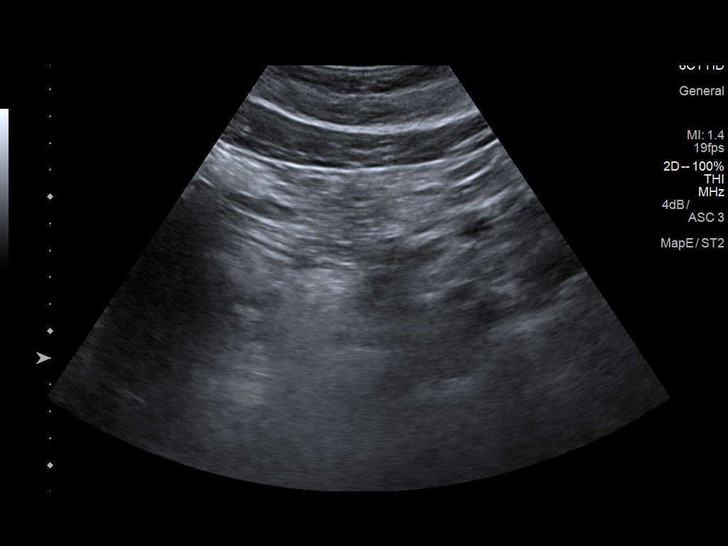
[im 5/49]
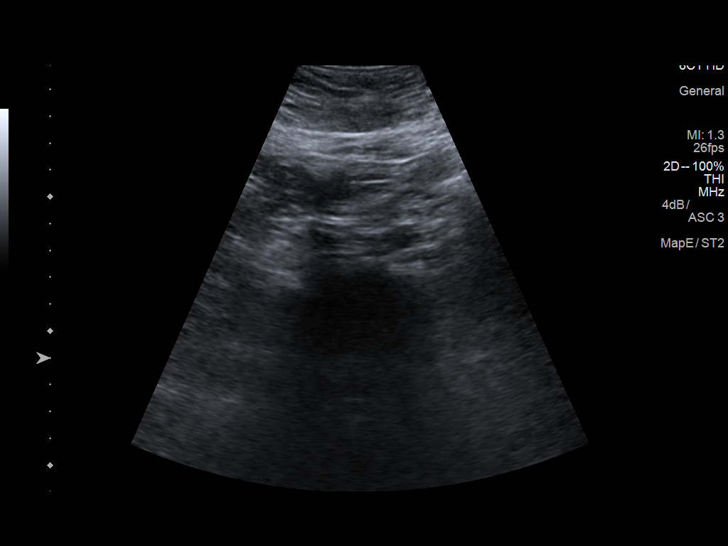
[im 9/49]
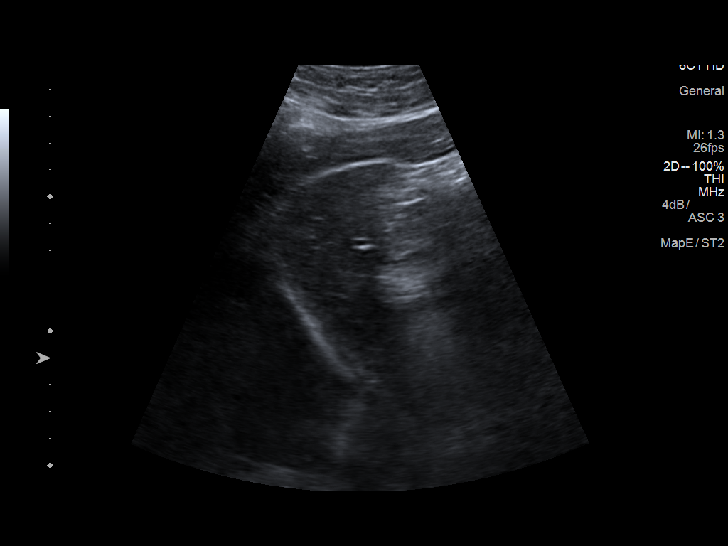
[im 13/49]
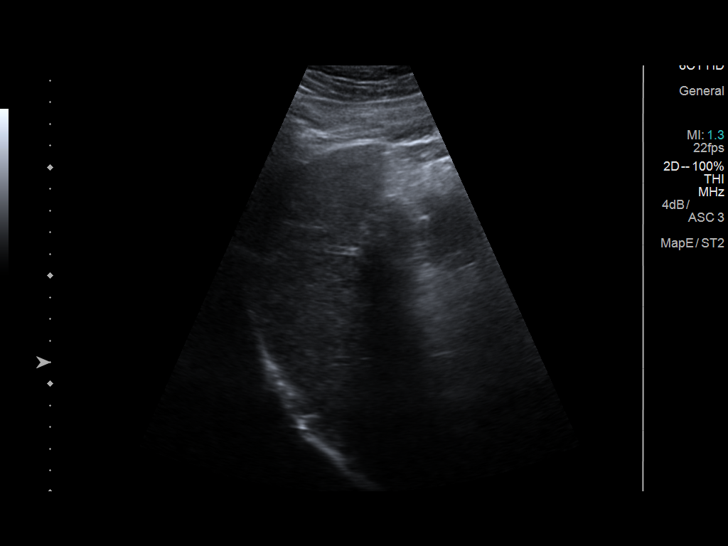
[im 17/49]
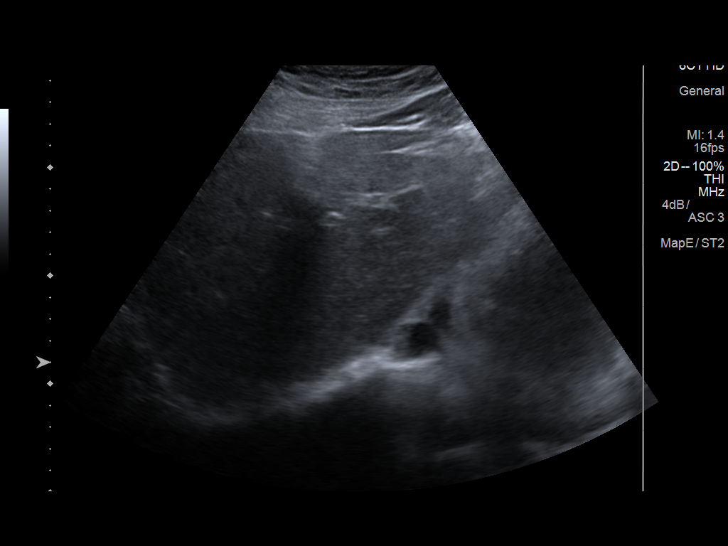
[im 19/49]
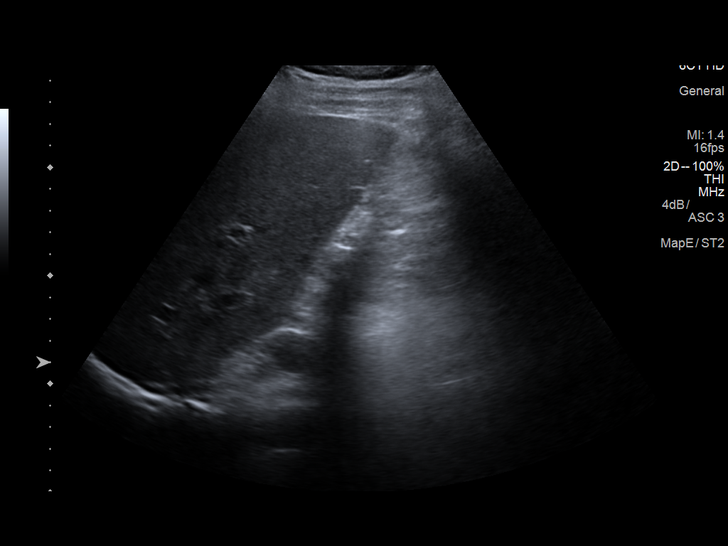
[im 23/49]
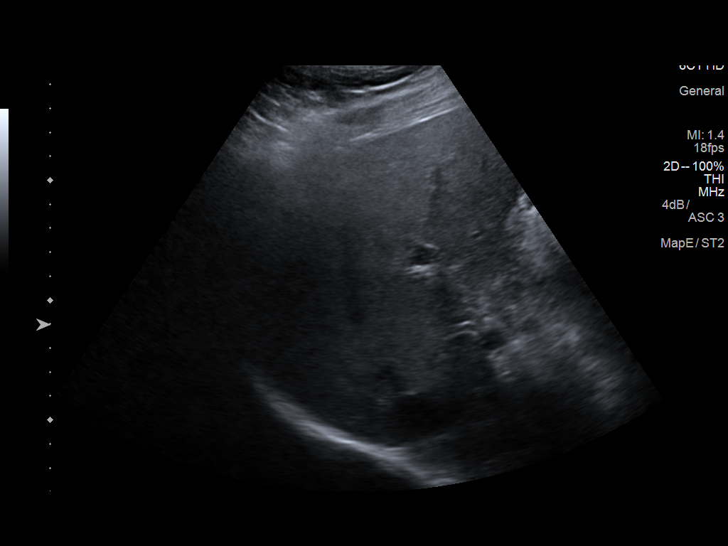
[im 27/49]
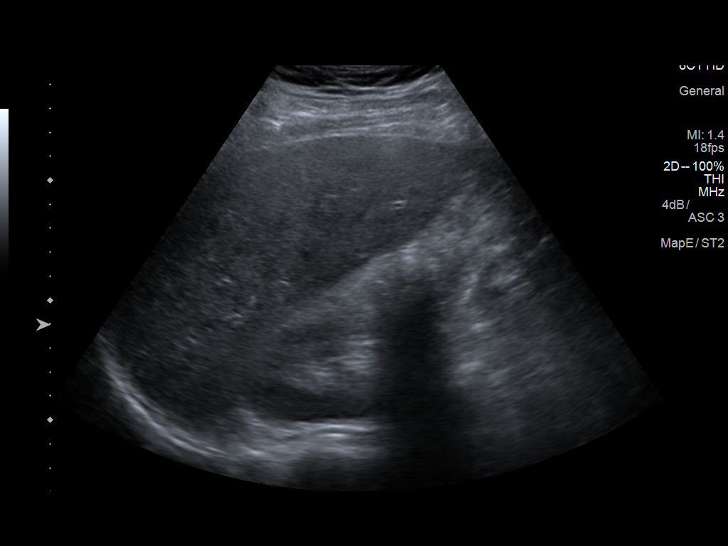
[im 31/49]
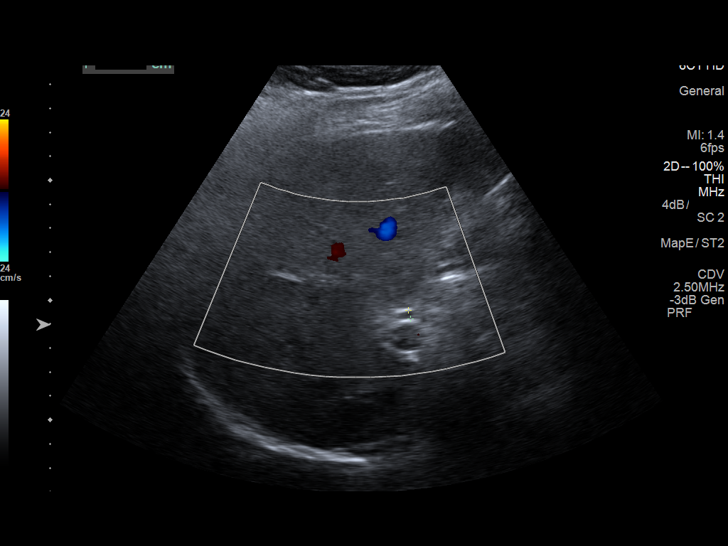
[im 33/49]
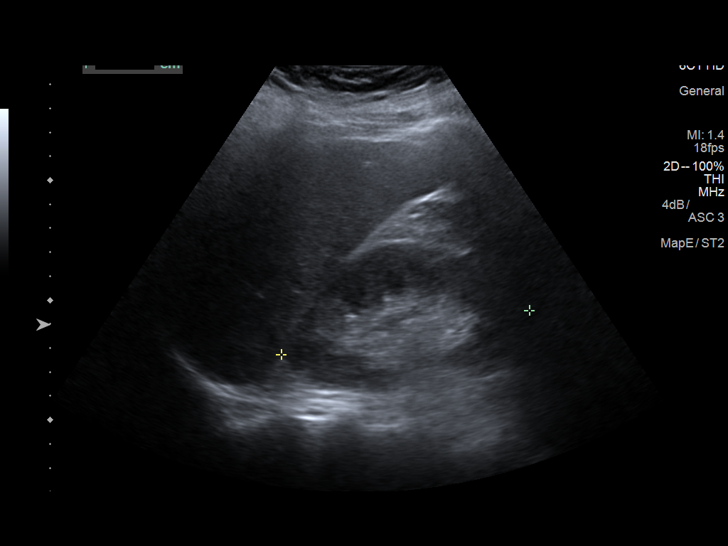
[im 37/49]
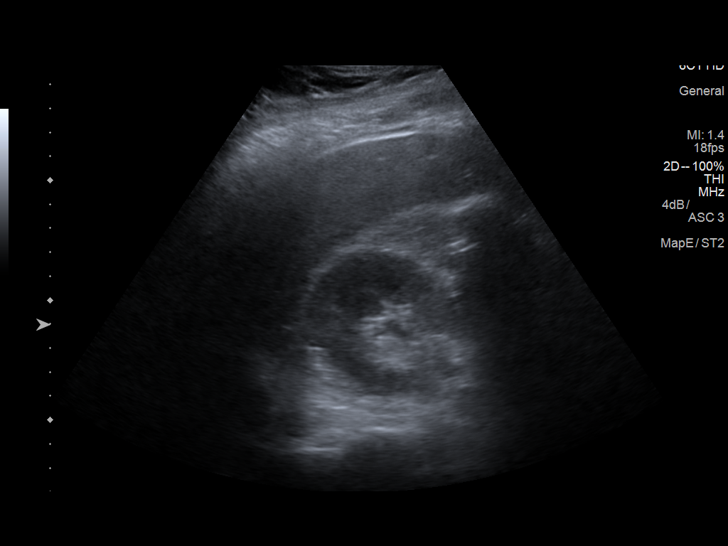
[im 41/49]
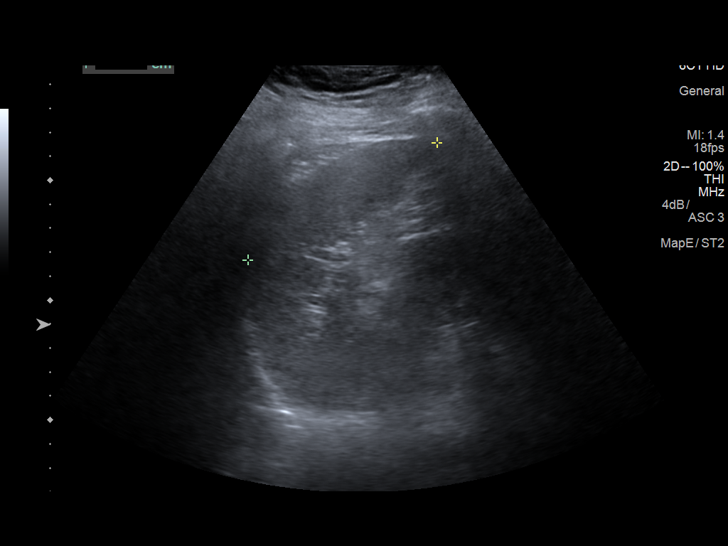
[im 45/49]
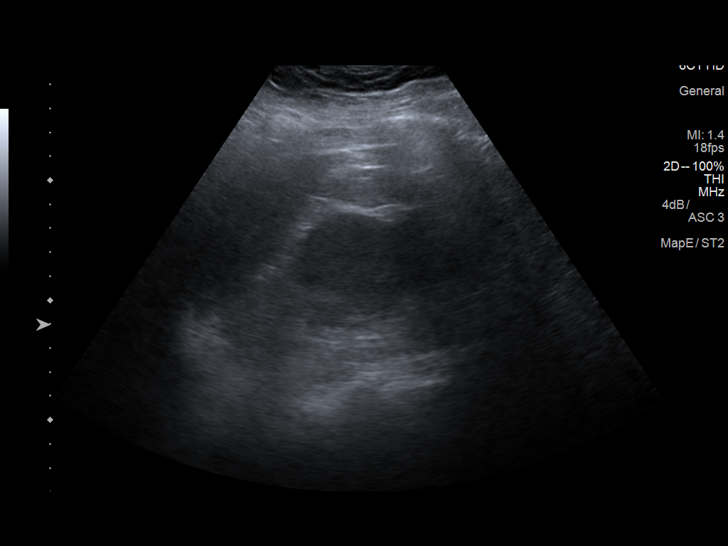
[im 49/49]
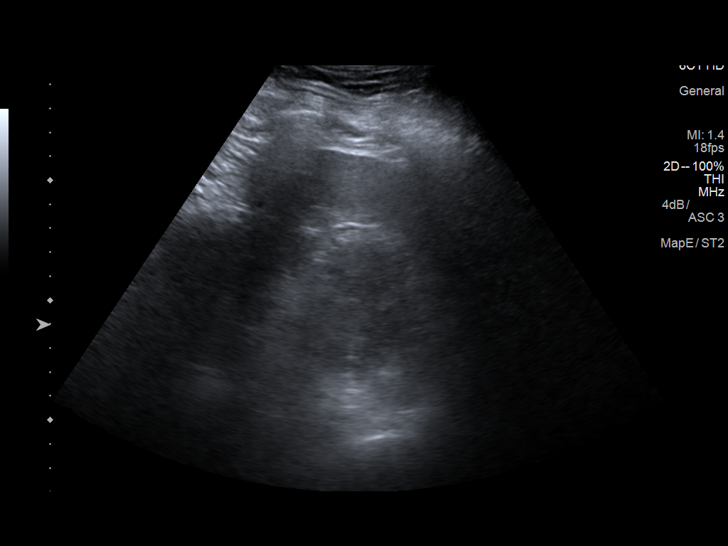

[14 of 25 positions shown; findings below may reference images not displayed]

FINDINGS: Gallbladder: Surgically absent

Common bile duct: Diameter: 4 mm

Liver: Mildly echogenic liver parenchyma without focal lesion.
Portal vein is patent on color Doppler imaging with normal direction
of blood flow towards the liver.

IVC: No abnormality visualized.

Pancreas: Visualized portion unremarkable.

Spleen: Size and appearance within normal limits.

Right Kidney: Length: 10.5 cm. Echogenicity within normal limits. No
mass or hydronephrosis visualized.

Left Kidney: Length: 11.1 cm. Echogenicity within normal limits. No
mass or hydronephrosis visualized.

Abdominal aorta: No aneurysm visualized.

Other findings: None.
IMPRESSION: Postop cholecystectomy without biliary dilatation

Mild fatty infiltration liver.

## 2019-06-19 ENCOUNTER — Emergency Department
Admission: EM | Admit: 2019-06-19 | Discharge: 2019-06-19 | Disposition: A | Discharge status: Home / Self Care | Payer: Medicaid Other | Attending: Emergency Medicine | Admitting: Emergency Medicine

## 2019-06-19 ENCOUNTER — Encounter: Payer: Self-pay | Admitting: Intensive Care

## 2019-06-19 ENCOUNTER — Other Ambulatory Visit: Payer: Self-pay

## 2019-06-19 DIAGNOSIS — Z79899 Other long term (current) drug therapy: Secondary | ICD-10-CM | POA: Diagnosis not present

## 2019-06-19 DIAGNOSIS — R1012 Left upper quadrant pain: Secondary | ICD-10-CM | POA: Diagnosis present

## 2019-06-19 DIAGNOSIS — K2970 Gastritis, unspecified, without bleeding: Secondary | ICD-10-CM | POA: Diagnosis not present

## 2019-06-19 DIAGNOSIS — R109 Unspecified abdominal pain: Secondary | ICD-10-CM

## 2019-06-19 LAB — COMPREHENSIVE METABOLIC PANEL
ALT: 16 U/L (ref 0–44)
AST: 19 U/L (ref 15–41)
Albumin: 4.8 g/dL (ref 3.5–5.0)
Alkaline Phosphatase: 57 U/L (ref 38–126)
Anion gap: 12 (ref 5–15)
BUN: 14 mg/dL (ref 6–20)
CO2: 27 mmol/L (ref 22–32)
Calcium: 9.7 mg/dL (ref 8.9–10.3)
Chloride: 105 mmol/L (ref 98–111)
Creatinine, Ser: 0.94 mg/dL (ref 0.61–1.24)
GFR calc Af Amer: >60 mL/min (ref >60)
GFR calc non Af Amer: >60 mL/min (ref >60)
Glucose, Bld: 117 mg/dL — ABNORMAL HIGH (ref 70–99)
Potassium: 4.2 mmol/L (ref 3.5–5.1)
Sodium: 144 mmol/L (ref 135–145)
Total Bilirubin: 0.5 mg/dL (ref 0.3–1.2)
Total Protein: 7.8 g/dL (ref 6.5–8.1)

## 2019-06-19 LAB — CBC
HCT: 43.2 % (ref 39.0–52.0)
Hemoglobin: 14 g/dL (ref 13.0–17.0)
MCH: 28.6 pg (ref 26.0–34.0)
MCHC: 32.4 g/dL (ref 30.0–36.0)
MCV: 88.2 fL (ref 80.0–100.0)
Platelets: 214 10*3/uL (ref 150–400)
RBC: 4.9 MIL/uL (ref 4.22–5.81)
RDW: 14 % (ref 11.5–15.5)
WBC: 11.6 10*3/uL — ABNORMAL HIGH (ref 4.0–10.5)
nRBC: 0 % (ref 0.0–0.2)

## 2019-06-19 LAB — URINALYSIS, COMPLETE (UACMP) WITH MICROSCOPIC
Bacteria, UA: NONE SEEN
Bilirubin Urine: NEGATIVE
Glucose, UA: NEGATIVE mg/dL
Hgb urine dipstick: NEGATIVE
Ketones, ur: NEGATIVE mg/dL
Leukocytes,Ua: NEGATIVE
Nitrite: NEGATIVE
Protein, ur: NEGATIVE mg/dL
Specific Gravity, Urine: 1.026 (ref 1.005–1.030)
pH: 5 (ref 5.0–8.0)

## 2019-06-19 LAB — LIPASE, BLOOD: Lipase: 24 U/L (ref 11–51)

## 2019-06-19 MED ORDER — SUCRALFATE 1 G PO TABS: 1.0000 g | ORAL_TABLET | Freq: Four times a day (QID) | ORAL | 0 refills | Status: DC

## 2019-06-19 NOTE — ED Provider Notes (Signed)
[facility] Emergency Department Provider Note   ____________________________________________   I have reviewed the triage vital signs and the nursing notes.   HISTORY  Chief Complaint Abdominal Pain   History limited by: Not Limited   HPI Darrell Hughes is a 29 y.o. male who presents to the emergency department today because of concerns for abdominal pain.  Patient states that the pain is located in his left upper quadrant.  He has had the pain on and off however it is been constant the past 2 days.  He denies any unusual ingestions 2 days ago.  Denies any alcohol use or over-the-counter pain medication.  He states he has a history of H. pylori and is currently taking an antacid.  Has had some loose stool.  No blood in his stool.  No vomiting.  No fevers.   Records reviewed. Per medical record review patient has a history of h. Pylori.   Past Medical History:  Diagnosis Date  . Anxiety   . Cardiac murmur   . Chest pain   . Cholelithiasis   . Chronic pain   . Dyspnea   . Elevated blood pressure reading   . Family history of adverse reaction to anesthesia   . H. pylori infection   . 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 Hyperlipidemia   . Low back pain   . Murmur, cardiac    as an infant, has resolved  . PONV (postoperative nausea and vomiting)    pt may have eaten prior to surgery.  . Right shoulder pain   . Vitamin D deficiency     Patient Active Problem List   Diagnosis Date Noted  . 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 CHOLECYSTECTOMY N/A 04/30/2017   Procedure: LAPAROSCOPIC CHOLECYSTECTOMY WITH INTRAOPERATIVE CHOLANGIOGRAM;  Surgeon: Kieth Brightly, MD;  Location: [facility] ORS;  Service: General;  Laterality: N/A;  . FOOT SURGERY Right 1996    from glass  . KNEE ARTHROSCOPY      Prior to Admission medications   Medication Sig Start Date End Date Taking? Authorizing Provider   colestipol (COLESTID) 1 g tablet Take 2 g by mouth 2 (two) times daily. 02/20/18 02/20/19  [provider]  dicyclomine (BENTYL) 20 MG tablet Take 1 tablet (20 mg total) by mouth 3 (three) times daily as needed for spasms. 02/26/18 02/26/19  Myrna Blazer, MD  hydrocortisone (PROCTOSOL HC) 2.5 % rectal cream Place 1 application rectally daily. 02/12/18   [provider]  hyoscyamine (LEVSIN, ANASPAZ) 0.125 MG tablet Take 0.125 mg by mouth every 4 (four) hours as needed for cramping. 07/03/18   [provider]  metoCLOPramide (REGLAN) 5 MG tablet Take 5 mg by mouth 2 (two) times daily. 07/03/18   [provider]  Nitroglycerin 0.4 % OINT Place 1 application rectally 2 (two) times daily. 06/24/18   [provider]  omeprazole (PRILOSEC) 40 MG capsule Take 40 mg by mouth 2 (two) times daily. 02/20/18   [provider]  pantoprazole (PROTONIX) 20 MG tablet Take 1 tablet (20 mg total) by mouth daily. 07/30/18 07/30/19  Jene Every, MD  PROAIR HFA 108 [phone] Base) MCG/ACT inhaler Inhale 2 puffs into the lungs every 4 (four) hours as needed for wheezing. 03/17/18   [provider]  topiramate (TOPAMAX) 25 MG tablet Take one tablet at night for one week, then take 2 tablets at night for one week, then take 3 tablets at  night. 09/18/18   York Spaniel, MD    Allergies Adhesive [tape]  Family History  Problem Relation Age of Onset  . Lung cancer Mother        and colon  . Migraines Mother   . Migraines Brother   . Schizophrenia Brother     Social History Social History   Tobacco Use  . Smoking status: Never Smoker  . Smokeless tobacco: Never Used  Substance Use Topics  . Alcohol use: No  . Drug use: No    Review of Systems Constitutional: No fever/chills Eyes: No visual changes. ENT: No sore throat. Cardiovascular: Denies chest pain. Respiratory: Denies shortness of breath. Gastrointestinal: Positive for abdominal  pain.  Genitourinary: Negative for dysuria. Musculoskeletal: Negative for back pain. Skin: Negative for rash. Neurological: Negative for headaches, focal weakness or numbness.  ____________________________________________   PHYSICAL EXAM:  VITAL SIGNS: ED Triage Vitals  Enc Vitals Group     BP 06/19/19 1625 (!) 150/67     Pulse Rate 06/19/19 1625 63     Resp 06/19/19 1625 16     Temp 06/19/19 1625 98.5 F (36.9 C)     Temp Source 06/19/19 1625 Oral     SpO2 06/19/19 1625 100 %     Weight 06/19/19 1645 264 lb (119.7 kg)     Height 06/19/19 1645 6' (1.829 m)     Head Circumference --      Peak Flow --      Pain Score 06/19/19 1645 5   Constitutional: Alert and oriented.  Eyes: Conjunctivae are normal.  ENT      Head: Normocephalic and atraumatic.      Nose: No congestion/rhinnorhea.      Mouth/Throat: Mucous membranes are moist.      Neck: No stridor. Cardiovascular: Normal rate, regular rhythm.  No murmurs, rubs, or gallops.  Respiratory: Normal respiratory effort without tachypnea nor retractions. Breath sounds are clear and equal bilaterally. No wheezes/rales/rhonchi. Gastrointestinal: Soft and minimally tender in the left upper quadrant.  Genitourinary: Deferred Musculoskeletal: Normal range of motion in all extremities.  Neurologic:  Normal speech and language. No gross focal neurologic deficits are appreciated.  Skin:  Skin is warm, dry and intact. No rash noted. Psychiatric: Mood and affect are normal. Speech and behavior are normal. Patient exhibits appropriate insight and judgment.  ____________________________________________    LABS (pertinent positives/negatives)  Lipase 24 CBC wbc 11.6, hgb 14.0, plt 214 CMP wnl except glu 117  ____________________________________________   EKG  I, Phineas Semen, attending physician, personally viewed and interpreted this EKG  EKG Time: 1704 Rate: 100 Rhythm: sinus tachycardia Axis: left axis  deviation Intervals: qtc 451 QRS: narrow ST changes: no st elevation Impression: abnormal ekg  ____________________________________________    RADIOLOGY  None  ____________________________________________   PROCEDURES  Procedures  ____________________________________________   INITIAL IMPRESSION / ASSESSMENT AND PLAN / ED COURSE  Pertinent labs & imaging results that were available during my care of the patient were reviewed by me and considered in my medical decision making (see chart for details).   Patient presented to the emergency department today because of concerns for left upper quadrant pain.  Patient has history of acid reflux.  On exam patient has some minimal tenderness in the left upper quadrant.  Blood work without concerning leukocytosis.  No other concerning abnormalities.  At this time I do think likely patient suffering from gastritis.  I discussed this with the patient.  Will plan on adding sucralfate to  his an acid.  Will give patient dietary guidelines.  ____________________________________________   FINAL CLINICAL IMPRESSION(S) / ED DIAGNOSES  Final diagnoses:  Abdominal pain, unspecified abdominal location  Gastritis without bleeding, unspecified chronicity, unspecified gastritis type     Note: This dictation was prepared with Dragon dictation. Any transcriptional errors that result from this process are unintentional     Nance Pear, MD 06/19/19 [phone]

## 2019-06-19 NOTE — ED Triage Notes (Signed)
C/o epigastric pain, worse with movement. Reports some N/V/D. Has had gallbladder removed.

## 2019-06-19 NOTE — ED Triage Notes (Signed)
Presents with abd pain   Also has had some n/v

## 2019-06-19 NOTE — ED Notes (Signed)
Pt assisted to toilet- instructed on use of urinal for UA sample.

## 2019-06-19 NOTE — Discharge Instructions (Addendum)
Please seek medical attention for any high fevers, chest pain, shortness of breath, change in behavior, persistent vomiting, bloody stool or any other new or concerning symptoms.  

## 2019-08-02 ENCOUNTER — Emergency Department: Payer: Medicaid Other

## 2019-08-02 ENCOUNTER — Other Ambulatory Visit: Payer: Self-pay

## 2019-08-02 ENCOUNTER — Emergency Department
Admission: EM | Admit: 2019-08-02 | Discharge: 2019-08-02 | Disposition: A | Discharge status: Home / Self Care | Payer: Medicaid Other | Attending: Emergency Medicine | Admitting: Emergency Medicine

## 2019-08-02 DIAGNOSIS — K8020 Calculus of gallbladder without cholecystitis without obstruction: Secondary | ICD-10-CM | POA: Insufficient documentation

## 2019-08-02 DIAGNOSIS — R1011 Right upper quadrant pain: Secondary | ICD-10-CM | POA: Diagnosis present

## 2019-08-02 DIAGNOSIS — R112 Nausea with vomiting, unspecified: Secondary | ICD-10-CM

## 2019-08-02 DIAGNOSIS — R197 Diarrhea, unspecified: Secondary | ICD-10-CM | POA: Diagnosis not present

## 2019-08-02 DIAGNOSIS — K8050 Calculus of bile duct without cholangitis or cholecystitis without obstruction: Secondary | ICD-10-CM

## 2019-08-02 LAB — CBC
HCT: 45.2 % (ref 39.0–52.0)
Hemoglobin: 14.7 g/dL (ref 13.0–17.0)
MCH: 28.7 pg (ref 26.0–34.0)
MCHC: 32.5 g/dL (ref 30.0–36.0)
MCV: 88.1 fL (ref 80.0–100.0)
Platelets: 207 10*3/uL (ref 150–400)
RBC: 5.13 MIL/uL (ref 4.22–5.81)
RDW: 13.5 % (ref 11.5–15.5)
WBC: 10 10*3/uL (ref 4.0–10.5)
nRBC: 0 % (ref 0.0–0.2)

## 2019-08-02 LAB — URINALYSIS, COMPLETE (UACMP) WITH MICROSCOPIC
Bacteria, UA: NONE SEEN
Bilirubin Urine: NEGATIVE
Glucose, UA: NEGATIVE mg/dL
Hgb urine dipstick: NEGATIVE
Ketones, ur: NEGATIVE mg/dL
Leukocytes,Ua: NEGATIVE
Nitrite: NEGATIVE
Protein, ur: NEGATIVE mg/dL
Specific Gravity, Urine: 1.025 (ref 1.005–1.030)
pH: 5 (ref 5.0–8.0)

## 2019-08-02 LAB — COMPREHENSIVE METABOLIC PANEL
ALT: 15 U/L (ref 0–44)
AST: 19 U/L (ref 15–41)
Albumin: 4.8 g/dL (ref 3.5–5.0)
Alkaline Phosphatase: 58 U/L (ref 38–126)
Anion gap: 7 (ref 5–15)
BUN: 12 mg/dL (ref 6–20)
CO2: 27 mmol/L (ref 22–32)
Calcium: 9.4 mg/dL (ref 8.9–10.3)
Chloride: 105 mmol/L (ref 98–111)
Creatinine, Ser: 0.97 mg/dL (ref 0.61–1.24)
GFR calc Af Amer: >60 mL/min (ref >60)
GFR calc non Af Amer: >60 mL/min (ref >60)
Glucose, Bld: 104 mg/dL — ABNORMAL HIGH (ref 70–99)
Potassium: 4.1 mmol/L (ref 3.5–5.1)
Sodium: 139 mmol/L (ref 135–145)
Total Bilirubin: 0.6 mg/dL (ref 0.3–1.2)
Total Protein: 7.6 g/dL (ref 6.5–8.1)

## 2019-08-02 LAB — LIPASE, BLOOD: Lipase: 22 U/L (ref 11–51)

## 2019-08-02 MED ORDER — HYOSCYAMINE SULFATE SL 0.125 MG SL SUBL: 0.2500 mg | SUBLINGUAL_TABLET | SUBLINGUAL | 0 refills | Status: DC | PRN

## 2019-08-02 MED ORDER — SODIUM CHLORIDE 0.9 % IV BOLUS
1000.0000 mL | Freq: Once | INTRAVENOUS | Status: AC
  Administered 2019-08-02: 1000 mL via INTRAVENOUS

## 2019-08-02 MED ORDER — ONDANSETRON HCL 4 MG/2ML IJ SOLN
4.0000 mg | Freq: Once | INTRAMUSCULAR | Status: AC
  Administered 2019-08-02: 4 mg via INTRAVENOUS
  Filled 2019-08-02: qty 2

## 2019-08-02 NOTE — ED Provider Notes (Signed)
[facility] Emergency Department Provider Note ____________________________________________   None    (approximate)  I have reviewed the triage vital signs and the nursing notes.   HISTORY  Chief Complaint Abdominal Pain  HPI Darrell Hughes is a 29 y.o. male presents to the emergency department for treatment and evaluation of abdominal pain that feels similar to the pain he had prior to his cholecystectomy 2 years ago.  He states that he has had intermittent pain with nausea, vomiting, and diarrhea since his gallbladder was removed.  He states that GI evaluated him and he had an upper GI which did not show any concerns.  Pain started 2 days ago and is located transverse upper abdomen but worse in the right upper quadrant.  Had some nausea, vomiting, and diarrhea.  He denies fever.  No known contact with others who are ill.  No known exposure to COVID-19.  No relief with Zofran.        Past Medical History:  Diagnosis Date  . Anxiety   . Cardiac murmur   . Chest pain   . Cholelithiasis   . Chronic pain   . Dyspnea   . Elevated blood pressure reading   . Family history of adverse reaction to anesthesia   . H. pylori infection   . 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 Hyperlipidemia   . Low back pain   . Murmur, cardiac    as an infant, has resolved  . PONV (postoperative nausea and vomiting)    pt may have eaten prior to surgery.  . Right shoulder pain   . Vitamin D deficiency     Patient Active Problem List   Diagnosis Date Noted  . 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 CHOLECYSTECTOMY N/A 04/30/2017   Procedure: LAPAROSCOPIC CHOLECYSTECTOMY WITH INTRAOPERATIVE CHOLANGIOGRAM;  Surgeon: Kieth Brightly, MD;  Location: [facility] ORS;  Service: General;  Laterality: N/A;  . FOOT SURGERY Right 1996    from glass  . KNEE ARTHROSCOPY      Prior to Admission medications   Medication Sig Start  Date End Date Taking? Authorizing Provider  colestipol (COLESTID) 1 g tablet Take 2 g by mouth 2 (two) times daily. 02/20/18 02/20/19  [provider]  dicyclomine (BENTYL) 20 MG tablet Take 1 tablet (20 mg total) by mouth 3 (three) times daily as needed for spasms. 02/26/18 02/26/19  Myrna Blazer, MD  hydrocortisone (PROCTOSOL HC) 2.5 % rectal cream Place 1 application rectally daily. 02/12/18   [provider]  Hyoscyamine Sulfate SL (LEVSIN/SL) 0.125 MG SUBL Place 0.25 mg under the tongue every 4 (four) hours as needed. 08/02/19   Rickie Gutierres, Rulon Eisenmenger B, FNP  metoCLOPramide (REGLAN) 5 MG tablet Take 5 mg by mouth 2 (two) times daily. 07/03/18   [provider]  Nitroglycerin 0.4 % OINT Place 1 application rectally 2 (two) times daily. 06/24/18   [provider]  omeprazole (PRILOSEC) 40 MG capsule Take 40 mg by mouth 2 (two) times daily. 02/20/18   [provider]  pantoprazole (PROTONIX) 20 MG tablet Take 1 tablet (20 mg total) by mouth daily. 07/30/18 07/30/19  Jene Every, MD  PROAIR HFA 108 [phone] Base) MCG/ACT inhaler Inhale 2 puffs into the lungs every 4 (four) hours as needed for wheezing. 03/17/18   [provider]  sucralfate (CARAFATE) 1 g tablet Take 1 tablet (1 g total) by mouth 4 (four) times daily. 06/19/19  Nance Pear, MD  topiramate (TOPAMAX) 25 MG tablet Take one tablet at night for one week, then take 2 tablets at night for one week, then take 3 tablets at night. 09/18/18   Kathrynn Ducking, MD    Allergies Adhesive [tape]  Family History  Problem Relation Age of Onset  . Lung cancer Mother        and colon  . Migraines Mother   . Migraines Brother   . Schizophrenia Brother     Social History Social History   Tobacco Use  . Smoking status: Never Smoker  . Smokeless tobacco: Never Used  Substance Use Topics  . Alcohol use: No  . Drug use: No    Review of Systems  Constitutional: No fever/chills Eyes:  No visual changes. ENT: No sore throat. Cardiovascular: Denies chest pain. Respiratory: Denies shortness of breath. Gastrointestinal: Positive for abdominal pain with nausea, vomiting, and diarrhea.. Genitourinary: Negative for dysuria. Musculoskeletal: Negative for back pain. Skin: Negative for rash. Neurological: Negative for headaches, focal weakness or numbness. ____________________________________________   PHYSICAL EXAM:  VITAL SIGNS: ED Triage Vitals  Enc Vitals Group     BP 08/02/19 1320 (!) 150/76     Pulse Rate 08/02/19 1320 65     Resp 08/02/19 1320 16     Temp 08/02/19 1320 98.8 F (37.1 C)     Temp Source 08/02/19 1320 Oral     SpO2 08/02/19 1320 100 %     Weight 08/02/19 1318 264 lb (119.7 kg)     Height 08/02/19 1318 6' (1.829 m)     Head Circumference --      Peak Flow --      Pain Score 08/02/19 1318 6     Pain Loc --      Pain Edu? --      Excl. in Colp? --     Constitutional: Alert and oriented. Well appearing and in no acute distress. Eyes: Conjunctivae are normal. PERRL. Head: Atraumatic. Nose: No congestion/rhinnorhea. Mouth/Throat: Mucous membranes are moist.  Oropharynx non-erythematous. Neck: No stridor.   Hematological/Lymphatic/Immunilogical: No cervical lymphadenopathy. Cardiovascular: Normal rate, regular rhythm. Grossly normal heart sounds.  Good peripheral circulation. Respiratory: Normal respiratory effort.  No retractions. Lungs CTAB. Gastrointestinal: Soft.  Right upper quadrant tenderness.  No tenderness in the left upper quadrant with palpation. no distention. No abdominal bruits. No CVA tenderness. Genitourinary:  Musculoskeletal: No lower extremity tenderness nor edema.  No joint effusions. Neurologic:  Normal speech and language. No gross focal neurologic deficits are appreciated. No gait instability. Skin:  Skin is warm, dry and intact. No rash noted. Psychiatric: Mood and affect are normal. Speech and behavior are  normal.  ____________________________________________   LABS (all labs ordered are listed, but only abnormal results are displayed)  Labs Reviewed  COMPREHENSIVE METABOLIC PANEL - Abnormal; Notable for the following components:      Result Value   Glucose, Bld 104 (*)    All other components within normal limits  URINALYSIS, COMPLETE (UACMP) WITH MICROSCOPIC - Abnormal; Notable for the following components:   Color, Urine YELLOW (*)    APPearance CLEAR (*)    All other components within normal limits  LIPASE, BLOOD  CBC   ____________________________________________  EKG  Not indicated ____________________________________________  RADIOLOGY  ED MD interpretation:    Ultrasound shows no acute findings.  Official radiology report(s): US Abdomen Limited RUQ  Result Date: 08/02/2019 CLINICAL DATA:  Biliary colic.  History of cholecystectomy. EXAM: ULTRASOUND ABDOMEN LIMITED RIGHT  UPPER QUADRANT COMPARISON:  Abdominal ultrasound 03/19/2018 FINDINGS: Gallbladder: Surgically absent. Common bile duct: Diameter: 0.3 cm, within normal limits Liver: No focal lesion identified. Within normal limits in parenchymal echogenicity. Portal vein is patent on color Doppler imaging with normal direction of blood flow towards the liver. Other: None. IMPRESSION: No sonographic finding to explain the patient's right upper quadrant pain. Electronically Signed   By: Emmaline Kluver M.D.   On: 08/02/2019 15:58    ____________________________________________   PROCEDURES  Procedure(s) performed (including Critical Care):  Procedures  ____________________________________________   INITIAL IMPRESSION / ASSESSMENT AND PLAN     29 year old male presenting to the emergency department for treatment and evaluation of abdominal pain with nausea, vomiting, and diarrhea.  See HPI for further details.  Describes this as an acute on chronic event that he has had since his gallbladder was removed 2  years ago.  DIFFERENTIAL DIAGNOSIS  Choledocholithiasis, irritable bowel syndrome, colitis, gastroenteritis, COVID-19  ED COURSE  Chart review shows that patient has presented for similar symptoms multiple times over the past couple years.  He is also noted to be bradycardic today which is also his baseline and he denies any concerning cardiac symptoms.  While here, his labs including CBC, CMP, lipase and his urinalysis were all reassuring.  White blood cell count was not elevated.  Ultrasound does not show any findings of concern.  He has not experienced any nausea or vomiting while here in the emergency department.  We discussed performing a CT scan and he would like to have that done outpatient if needed.  He states that he will call and schedule a follow-up appointment with his GI doctor tomorrow.  Because his vitals are stable, he has had no fever, he feels that this is an acute on chronic issue, and his symptoms are currently well controlled I feel that this is reasonable.  He was however given strict ER return precautions. Levsin prescription submitted to his pharmacy. ____________________________________________   FINAL CLINICAL IMPRESSION(S) / ED DIAGNOSES  Final diagnoses:  Biliary colic  Nausea vomiting and diarrhea     ED Discharge Orders         Ordered    Hyoscyamine Sulfate SL (LEVSIN/SL) 0.125 MG SUBL  Every 4 hours PRN     08/02/19 1629           Scorpio Fortin was evaluated in Emergency Department on 08/02/2019 for the symptoms described in the history of present illness. He was evaluated in the context of the global COVID-19 pandemic, which necessitated consideration that the patient might be at risk for infection with the SARS-CoV-2 virus that causes COVID-19. Institutional protocols and algorithms that pertain to the evaluation of patients at risk for COVID-19 are in a state of rapid change based on information released by regulatory bodies including the CDC  and federal and state organizations. These policies and algorithms were followed during the patient's care in the ED.   Note:  This document was prepared using Dragon voice recognition software and may include unintentional dictation errors.   Chinita Pester, FNP 08/02/19 1710    Minna Antis, MD 08/02/19 2314

## 2019-08-02 NOTE — ED Triage Notes (Signed)
Pt states middle abd pain since Friday. States vomiting on Friday. A&O, ambulatory. No gallbladder. States diarrhea but no fever.

## 2019-10-10 ENCOUNTER — Encounter (HOSPITAL_COMMUNITY): Payer: Self-pay | Admitting: Emergency Medicine

## 2019-10-10 ENCOUNTER — Other Ambulatory Visit: Payer: Self-pay

## 2019-10-10 ENCOUNTER — Emergency Department (HOSPITAL_COMMUNITY): Payer: Medicaid Other

## 2019-10-10 ENCOUNTER — Emergency Department (HOSPITAL_COMMUNITY)
Admission: EM | Admit: 2019-10-10 | Discharge: 2019-10-10 | Disposition: A | Discharge status: Home / Self Care | Payer: Medicaid Other | Attending: Emergency Medicine | Admitting: Emergency Medicine

## 2019-10-10 DIAGNOSIS — R10815 Periumbilic abdominal tenderness: Secondary | ICD-10-CM | POA: Diagnosis not present

## 2019-10-10 DIAGNOSIS — R109 Unspecified abdominal pain: Secondary | ICD-10-CM

## 2019-10-10 DIAGNOSIS — R1030 Lower abdominal pain, unspecified: Secondary | ICD-10-CM | POA: Insufficient documentation

## 2019-10-10 DIAGNOSIS — Z79899 Other long term (current) drug therapy: Secondary | ICD-10-CM | POA: Insufficient documentation

## 2019-10-10 DIAGNOSIS — R112 Nausea with vomiting, unspecified: Secondary | ICD-10-CM | POA: Insufficient documentation

## 2019-10-10 LAB — COMPREHENSIVE METABOLIC PANEL
ALT: 15 U/L (ref 0–44)
AST: 16 U/L (ref 15–41)
Albumin: 4.1 g/dL (ref 3.5–5.0)
Alkaline Phosphatase: 65 U/L (ref 38–126)
Anion gap: 9 (ref 5–15)
BUN: 11 mg/dL (ref 6–20)
CO2: 25 mmol/L (ref 22–32)
Calcium: 9.1 mg/dL (ref 8.9–10.3)
Chloride: 107 mmol/L (ref 98–111)
Creatinine, Ser: 1.17 mg/dL (ref 0.61–1.24)
GFR calc Af Amer: >60 mL/min (ref >60)
GFR calc non Af Amer: >60 mL/min (ref >60)
Glucose, Bld: 94 mg/dL (ref 70–99)
Potassium: 4.3 mmol/L (ref 3.5–5.1)
Sodium: 141 mmol/L (ref 135–145)
Total Bilirubin: 0.3 mg/dL (ref 0.3–1.2)
Total Protein: 6.5 g/dL (ref 6.5–8.1)

## 2019-10-10 LAB — CBC
HCT: 42 % (ref 39.0–52.0)
Hemoglobin: 13.5 g/dL (ref 13.0–17.0)
MCH: 28.7 pg (ref 26.0–34.0)
MCHC: 32.1 g/dL (ref 30.0–36.0)
MCV: 89.4 fL (ref 80.0–100.0)
Platelets: 200 10*3/uL (ref 150–400)
RBC: 4.7 MIL/uL (ref 4.22–5.81)
RDW: 13.8 % (ref 11.5–15.5)
WBC: 11.2 10*3/uL — ABNORMAL HIGH (ref 4.0–10.5)
nRBC: 0 % (ref 0.0–0.2)

## 2019-10-10 LAB — LIPASE, BLOOD: Lipase: 25 U/L (ref 11–51)

## 2019-10-10 MED ORDER — KETOROLAC TROMETHAMINE 15 MG/ML IJ SOLN
15.0000 mg | Freq: Once | INTRAMUSCULAR | Status: AC
  Administered 2019-10-10: 15 mg via INTRAVENOUS
  Filled 2019-10-10: qty 1

## 2019-10-10 MED ORDER — SODIUM CHLORIDE 0.9% FLUSH: 3.0000 mL | Freq: Once | INTRAVENOUS | Status: DC

## 2019-10-10 MED ORDER — IOHEXOL 300 MG/ML  SOLN
100.0000 mL | Freq: Once | INTRAMUSCULAR | Status: AC | PRN
  Administered 2019-10-10: 100 mL via INTRAVENOUS

## 2019-10-10 MED ORDER — DICYCLOMINE HCL 20 MG PO TABS: 20.0000 mg | ORAL_TABLET | Freq: Four times a day (QID) | ORAL | 0 refills | Status: DC | PRN

## 2019-10-10 MED ORDER — HYDROMORPHONE HCL 1 MG/ML IJ SOLN
1.0000 mg | Freq: Once | INTRAMUSCULAR | Status: AC
  Administered 2019-10-10: 1 mg via INTRAVENOUS
  Filled 2019-10-10: qty 1

## 2019-10-10 MED ORDER — ONDANSETRON HCL 4 MG/2ML IJ SOLN
4.0000 mg | Freq: Once | INTRAMUSCULAR | Status: AC
  Administered 2019-10-10: 4 mg via INTRAVENOUS
  Filled 2019-10-10: qty 2

## 2019-10-10 NOTE — ED Triage Notes (Signed)
C/o LLQ pain since yesterday morning with nausea and vomiting.  States it is now generalized abd pain.  States it is difficult to have a BM but when he does it is diarrhea.

## 2019-10-10 NOTE — ED Provider Notes (Signed)
[facility] EMERGENCY DEPARTMENT Provider Note   CSN: [id number] Arrival date & time: 10/10/19  1617     History Chief Complaint  Patient presents with  . Abdominal Pain    Darrell Hughes is a 29 y.o. male.  HPI   29 year old male with abdominal pain.  Onset yesterday.  Pain is in lower abdomen, worse on the left side.  Assessment nausea and vomiting.  Some loose stools.  No blood in stool or emesis.  No urinary complaints.  No fevers or chills.  No sick contacts he is aware of.  Surgical history is significant for cholecystectomy.  He has tried taking Tylenol without significant improvement.  Past Medical History:  Diagnosis Date  . Anxiety   . Cardiac murmur   . Chest pain   . Cholelithiasis   . Chronic pain   . Dyspnea   . Elevated blood pressure reading   . Family history of adverse reaction to anesthesia   . H. pylori infection   . 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 Hyperlipidemia   . Low back pain   . Murmur, cardiac    as an infant, has resolved  . PONV (postoperative nausea and vomiting)    pt may have eaten prior to surgery.  . Right shoulder pain   . Vitamin D deficiency     Patient Active Problem List   Diagnosis Date Noted  . 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 CHOLECYSTECTOMY N/A 04/30/2017   Procedure: LAPAROSCOPIC CHOLECYSTECTOMY WITH INTRAOPERATIVE CHOLANGIOGRAM;  Surgeon: Kieth Brightly, MD;  Location: [facility] ORS;  Service: General;  Laterality: N/A;  . FOOT SURGERY Right 1996    from glass  . KNEE ARTHROSCOPY         Family History  Problem Relation Age of Onset  . Lung cancer Mother        and colon  . Migraines Mother   . Migraines Brother   . Schizophrenia Brother     Social History   Tobacco Use  . Smoking status: Never Smoker  . Smokeless tobacco: Never Used  Substance Use Topics  . Alcohol use: No  . Drug use: No    Home Medications Prior  to Admission medications   Medication Sig Start Date End Date Taking? Authorizing Provider  colestipol (COLESTID) 1 g tablet Take 2 g by mouth 2 (two) times daily. 02/20/18 02/20/19  [provider]  dicyclomine (BENTYL) 20 MG tablet Take 1 tablet (20 mg total) by mouth 3 (three) times daily as needed for spasms. 02/26/18 02/26/19  Myrna Blazer, MD  hydrocortisone (PROCTOSOL HC) 2.5 % rectal cream Place 1 application rectally daily. 02/12/18   [provider]  Hyoscyamine Sulfate SL (LEVSIN/SL) 0.125 MG SUBL Place 0.25 mg under the tongue every 4 (four) hours as needed. 08/02/19   Triplett, Rulon Eisenmenger B, FNP  metoCLOPramide (REGLAN) 5 MG tablet Take 5 mg by mouth 2 (two) times daily. 07/03/18   [provider]  Nitroglycerin 0.4 % OINT Place 1 application rectally 2 (two) times daily. 06/24/18   [provider]  omeprazole (PRILOSEC) 40 MG capsule Take 40 mg by mouth 2 (two) times daily. 02/20/18   [provider]  pantoprazole (PROTONIX) 20 MG tablet Take 1 tablet (20 mg total) by mouth daily. 07/30/18 07/30/19  Jene Every, MD  PROAIR HFA 108 [phone] Base) MCG/ACT inhaler Inhale 2 puffs into the lungs every 4 (four) hours as needed  for wheezing. 03/17/18   [provider]  sucralfate (CARAFATE) 1 g tablet Take 1 tablet (1 g total) by mouth 4 (four) times daily. 06/19/19   Nance Pear, MD  topiramate (TOPAMAX) 25 MG tablet Take one tablet at night for one week, then take 2 tablets at night for one week, then take 3 tablets at night. 09/18/18   Kathrynn Ducking, MD    Allergies    Adhesive [tape]  Review of Systems   Review of Systems All systems reviewed and negative, other than as noted in HPI.  Physical Exam Updated Vital Signs BP 140/77   Pulse 61   Temp 98.5 F (36.9 C) (Oral)   Resp 18   Ht 6' (1.829 m)   Wt 119.7 kg   SpO2 100%   BMI 35.80 kg/m   Physical Exam Vitals and nursing note reviewed.  Constitutional:       General: He is not in acute distress.    Appearance: He is well-developed.  HENT:     Head: Normocephalic and atraumatic.  Eyes:     General:        Right eye: No discharge.        Left eye: No discharge.     Conjunctiva/sclera: Conjunctivae normal.  Cardiovascular:     Rate and Rhythm: Normal rate and regular rhythm.     Heart sounds: Normal heart sounds. No murmur. No friction rub. No gallop.   Pulmonary:     Effort: Pulmonary effort is normal. No respiratory distress.     Breath sounds: Normal breath sounds.  Abdominal:     General: There is no distension.     Palpations: Abdomen is soft.     Tenderness: There is abdominal tenderness.     Comments: Suprapubic and left lower quadrant tenderness without rebound or guarding.  No distention.  Musculoskeletal:        General: No tenderness.     Cervical back: Neck supple.  Skin:    General: Skin is warm and dry.  Neurological:     Mental Status: He is alert.  Psychiatric:        Behavior: Behavior normal.        Thought Content: Thought content normal.     ED Results / Procedures / Treatments   Labs (all labs ordered are listed, but only abnormal results are displayed) Labs Reviewed  CBC - Abnormal; Notable for the following components:      Result Value   WBC 11.2 (*)    All other components within normal limits  LIPASE, BLOOD  COMPREHENSIVE METABOLIC PANEL    EKG None  Radiology No results found.   CT ABDOMEN PELVIS W CONTRAST  Result Date: 10/10/2019 CLINICAL DATA:  Acute abdominal pain. EXAM: CT ABDOMEN AND PELVIS WITH CONTRAST TECHNIQUE: Multidetector CT imaging of the abdomen and pelvis was performed using the standard protocol following bolus administration of intravenous contrast. CONTRAST:  1109mL OMNIPAQUE IOHEXOL 300 MG/ML  SOLN COMPARISON:  02/26/2018. FINDINGS: Lower chest: The lung bases are clear. The heart size is normal. Hepatobiliary: The liver is normal. Status post cholecystectomy.There is no  biliary ductal dilation. Pancreas: Normal contours without ductal dilatation. No peripancreatic fluid collection. Spleen: Unremarkable. Adrenals/Urinary Tract: --Adrenal glands: Unremarkable. --Right kidney/ureter: No hydronephrosis or radiopaque kidney stones. --Left kidney/ureter: No hydronephrosis or radiopaque kidney stones. --Urinary bladder: Unremarkable. Stomach/Bowel: --Stomach/Duodenum: No hiatal hernia or other gastric abnormality. Normal duodenal course and caliber. --Small bowel: Unremarkable. --Colon: Unremarkable. --Appendix: Normal. Vascular/Lymphatic: Normal  course and caliber of the major abdominal vessels. --No retroperitoneal lymphadenopathy. --No mesenteric lymphadenopathy. --No pelvic or inguinal lymphadenopathy. Reproductive: Unremarkable Other: No ascites or free air. The abdominal wall is normal. Musculoskeletal. No acute displaced fractures. IMPRESSION: No acute abdominopelvic abnormality. Electronically Signed   By: Katherine Mantle M.D.   On: 10/10/2019 20:33    Procedures Procedures (including critical care time)  Medications Ordered in ED Medications  sodium chloride flush (NS) 0.9 % injection 3 mL (0 mLs Intravenous Hold 10/10/19 1927)  HYDROmorphone (DILAUDID) injection 1 mg (1 mg Intravenous Given 10/10/19 1928)  ondansetron (ZOFRAN) injection 4 mg (4 mg Intravenous Given 10/10/19 1927)  ketorolac (TORADOL) 15 MG/ML injection 15 mg (15 mg Intravenous Given 10/10/19 1927)    ED Course  I have reviewed the triage vital signs and the nursing notes.  Pertinent labs & imaging results that were available during my care of the patient were reviewed by me and considered in my medical decision making (see chart for details).    MDM Rules/Calculators/A&P                     29 year old male with abdominal pain.  Unclear etiology but doubt emergent condition.  CT largely reassuring.  Plan symptomatic treatment.  Return precautions discussed.  Outpatient follow-up otherwise.      Final Clinical Impression(s) / ED Diagnoses Final diagnoses:  Abdominal pain, unspecified abdominal location    Rx / DC Orders ED Discharge Orders    None       Raeford Razor, MD 10/15/19 [phone]

## 2019-11-23 ENCOUNTER — Encounter: Payer: Self-pay | Admitting: Emergency Medicine

## 2019-11-23 ENCOUNTER — Emergency Department
Admission: EM | Admit: 2019-11-23 | Discharge: 2019-11-23 | Disposition: A | Discharge status: Home / Self Care | Payer: Medicaid Other | Attending: Emergency Medicine | Admitting: Emergency Medicine

## 2019-11-23 ENCOUNTER — Other Ambulatory Visit: Payer: Self-pay

## 2019-11-23 DIAGNOSIS — G5602 Carpal tunnel syndrome, left upper limb: Secondary | ICD-10-CM | POA: Insufficient documentation

## 2019-11-23 DIAGNOSIS — Z79899 Other long term (current) drug therapy: Secondary | ICD-10-CM | POA: Diagnosis not present

## 2019-11-23 DIAGNOSIS — M545 Low back pain: Secondary | ICD-10-CM | POA: Diagnosis present

## 2019-11-23 DIAGNOSIS — M5431 Sciatica, right side: Secondary | ICD-10-CM | POA: Insufficient documentation

## 2019-11-23 MED ORDER — METHOCARBAMOL 500 MG PO TABS: 500.0000 mg | ORAL_TABLET | Freq: Four times a day (QID) | ORAL | 0 refills | Status: DC

## 2019-11-23 MED ORDER — ORPHENADRINE CITRATE 30 MG/ML IJ SOLN
60.0000 mg | Freq: Once | INTRAMUSCULAR | Status: AC
  Administered 2019-11-23: 60 mg via INTRAMUSCULAR
  Filled 2019-11-23: qty 2

## 2019-11-23 MED ORDER — MELOXICAM 15 MG PO TABS: 15.0000 mg | ORAL_TABLET | Freq: Every day | ORAL | 0 refills | Status: DC

## 2019-11-23 MED ORDER — KETOROLAC TROMETHAMINE 30 MG/ML IJ SOLN
30.0000 mg | Freq: Once | INTRAMUSCULAR | Status: AC
  Administered 2019-11-23: 30 mg via INTRAMUSCULAR
  Filled 2019-11-23: qty 1

## 2019-11-23 NOTE — ED Provider Notes (Signed)
[facility] Emergency Department Provider Note  ____________________________________________  Time seen: Approximately 4:56 PM  I have reviewed the triage vital signs and the nursing notes.   HISTORY  Chief Complaint Back Pain    HPI Darrell Hughes is a 29 y.o. male who presents the emergency department complaining of 2 complaints.  Patient is complaining of back pain x1 week, left wrist pain beginning today.  Patient denies injuries to either area.  He states that the pain in his back radiates into the right leg.  No bowel or bladder dysfunction, saddle esthesia or paresthesias.  No chronic low back pain.  No injuries to include fall or MVC.  No medications for his complaint prior to arrival.  Patient denies any trauma or injury to the left wrist.  He states that the pain is mostly on the anterior aspect of the wrist with some pain radiating into the third, fourth and fifth digits.  Patient does do repetitive work with his hands.  Again no direct injury.  No erythema or edema to the left wrist.  No medications for this complaint either.         Past Medical History:  Diagnosis Date  . Anxiety   . Cardiac murmur   . Chest pain   . Cholelithiasis   . Chronic pain   . Dyspnea   . Elevated blood pressure reading   . Family history of adverse reaction to anesthesia   . H. pylori infection   . 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 Hyperlipidemia   . Low back pain   . Murmur, cardiac    as an infant, has resolved  . PONV (postoperative nausea and vomiting)    pt may have eaten prior to surgery.  . Right shoulder pain   . Vitamin D deficiency     Patient Active Problem List   Diagnosis Date Noted  . 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 CHOLECYSTECTOMY N/A 04/30/2017   Procedure: LAPAROSCOPIC CHOLECYSTECTOMY WITH INTRAOPERATIVE CHOLANGIOGRAM;  Surgeon: Kieth Brightly, MD;  Location: [facility] ORS;   Service: General;  Laterality: N/A;  . FOOT SURGERY Right 1996    from glass  . KNEE ARTHROSCOPY      Prior to Admission medications   Medication Sig Start Date End Date Taking? Authorizing Provider  colestipol (COLESTID) 1 g tablet Take 2 g by mouth 2 (two) times daily. 02/20/18 02/20/19  [provider]  dicyclomine (BENTYL) 20 MG tablet Take 1 tablet (20 mg total) by mouth every 6 (six) hours as needed for spasms. 10/10/19   Raeford Razor, MD  hydrocortisone (PROCTOSOL HC) 2.5 % rectal cream Place 1 application rectally daily. 02/12/18   [provider]  Hyoscyamine Sulfate SL (LEVSIN/SL) 0.125 MG SUBL Place 0.25 mg under the tongue every 4 (four) hours as needed. 08/02/19   Triplett, Rulon Eisenmenger B, FNP  meloxicam (MOBIC) 15 MG tablet Take 1 tablet (15 mg total) by mouth daily. 11/23/19   Ansley Mangiapane, Delorise Royals, PA-C  methocarbamol (ROBAXIN) 500 MG tablet Take 1 tablet (500 mg total) by mouth 4 (four) times daily. 11/23/19   Maleik Vanderzee, Delorise Royals, PA-C  metoCLOPramide (REGLAN) 5 MG tablet Take 5 mg by mouth 2 (two) times daily. 07/03/18   [provider]  Nitroglycerin 0.4 % OINT Place 1 application rectally 2 (two) times daily. 06/24/18   [provider]  omeprazole (PRILOSEC) 40 MG capsule Take 40 mg by mouth 2 (two) times  daily. 02/20/18   [provider]  pantoprazole (PROTONIX) 20 MG tablet Take 1 tablet (20 mg total) by mouth daily. 07/30/18 07/30/19  Jene Every, MD  PROAIR HFA 108 [phone] Base) MCG/ACT inhaler Inhale 2 puffs into the lungs every 4 (four) hours as needed for wheezing. 03/17/18   [provider]  sucralfate (CARAFATE) 1 g tablet Take 1 tablet (1 g total) by mouth 4 (four) times daily. 06/19/19   Phineas Semen, MD  topiramate (TOPAMAX) 25 MG tablet Take one tablet at night for one week, then take 2 tablets at night for one week, then take 3 tablets at night. 09/18/18   York Spaniel, MD    Allergies Adhesive [tape]  Family  History  Problem Relation Age of Onset  . Lung cancer Mother        and colon  . Migraines Mother   . Migraines Brother   . Schizophrenia Brother     Social History Social History   Tobacco Use  . Smoking status: Never Smoker  . Smokeless tobacco: Never Used  Vaping Use  . Vaping Use: Former  Substance Use Topics  . Alcohol use: No  . Drug use: No     Review of Systems  Constitutional: No fever/chills Eyes: No visual changes. No discharge ENT: No upper respiratory complaints. Cardiovascular: no chest pain. Respiratory: no cough. No SOB. Gastrointestinal: No abdominal pain.  No nausea, no vomiting.  No diarrhea.  No constipation. Genitourinary: Negative for dysuria. No hematuria Musculoskeletal: Positive for lower back and left wrist pain Skin: Negative for rash, abrasions, lacerations, ecchymosis. Neurological: Negative for headaches, focal weakness or numbness. 10-point ROS otherwise negative.  ____________________________________________   PHYSICAL EXAM:  VITAL SIGNS: ED Triage Vitals [11/23/19 1516]  Enc Vitals Group     BP 129/80     Pulse Rate 73     Resp 16     Temp 98.6 F (37 C)     Temp Source Oral     SpO2 97 %     Weight 264 lb (119.7 kg)     Height 6' (1.829 m)     Head Circumference      Peak Flow      Pain Score 6     Pain Loc      Pain Edu?      Excl. in GC?      Constitutional: Alert and oriented. Well appearing and in no acute distress. Eyes: Conjunctivae are normal. PERRL. EOMI. Head: Atraumatic. ENT:      Ears:       Nose: No congestion/rhinnorhea.      Mouth/Throat: Mucous membranes are moist.  Neck: No stridor.    Cardiovascular: Normal rate, regular rhythm. Normal S1 and S2.  Good peripheral circulation. Respiratory: Normal respiratory effort without tachypnea or retractions. Lungs CTAB. Good air entry to the bases with no decreased or absent breath sounds. Gastrointestinal: Bowel sounds 4 quadrants. Soft and nontender to  palpation. No guarding or rigidity. No palpable masses. No distention. No CVA tenderness. Musculoskeletal: Full range of motion to all extremities. No gross deformities appreciated.  Visualization of the lower back reveals no visible signs of trauma.  Good range of motion.  Mild tenderness along the right paraspinal muscle group extending into the right-sided sciatic notch.  Negative straight leg raise bilaterally.  Dorsalis pedis pulse and sensation intact bilateral lower extremities.  No palpable findings in the lower back.  No step-off.  Examination of the left wrist reveals no  erythema, edema.  Good range of motion.  Positive for Tinel's and Phalen's.  Negative for Finkelstein's.  No significant tenderness to palpation over the forearm or hand.  Radial pulse intact.  Sensation intact all digits.  Capillary refill less than 2 seconds all digits. Neurologic:  Normal speech and language. No gross focal neurologic deficits are appreciated.  Skin:  Skin is warm, dry and intact. No rash noted. Psychiatric: Mood and affect are normal. Speech and behavior are normal. Patient exhibits appropriate insight and judgement.   ____________________________________________   LABS (all labs ordered are listed, but only abnormal results are displayed)  Labs Reviewed - No data to display ____________________________________________  EKG   ____________________________________________  RADIOLOGY   No results found.  ____________________________________________    PROCEDURES  Procedure(s) performed:    Procedures    Medications  ketorolac (TORADOL) 30 MG/ML injection 30 mg (has no administration in time range)  orphenadrine (NORFLEX) injection 60 mg (has no administration in time range)     ____________________________________________   INITIAL IMPRESSION / ASSESSMENT AND PLAN / ED COURSE  Pertinent labs & imaging results that were available during my care of the patient were reviewed  by me and considered in my medical decision making (see chart for details).  Review of the [place] CSRS was performed in accordance of the [facility] prior to dispensing any controlled drugs.           Patient's diagnosis is consistent with sciatica, carpal tunnel.  Patient presented with 2 nontraumatic pain complaints of the left wrist and lower back.  Overall exam was reassuring.  No concerning symptoms warranting imaging or labs at this time.  Patient is given Toradol and Norflex for symptom relief.  Velcro wrist brace will be provided to the patient for additional improvement of his carpal tunnel syndrome.  Meloxicam and Robaxin at home.  Follow-up with orthopedics as needed..  Patient is given ED precautions to return to the ED for any worsening or new symptoms.     ____________________________________________  FINAL CLINICAL IMPRESSION(S) / ED DIAGNOSES  Final diagnoses:  Sciatica of right side  Carpal tunnel syndrome of left wrist      NEW MEDICATIONS STARTED DURING THIS VISIT:  ED Discharge Orders         Ordered    meloxicam (MOBIC) 15 MG tablet  Daily     Discontinue     11/23/19 1723    methocarbamol (ROBAXIN) 500 MG tablet  4 times daily     Discontinue     11/23/19 1723              This chart was dictated using voice recognition software/Dragon. Despite best efforts to proofread, errors can occur which can change the meaning. Any change was purely unintentional.    Racheal Patches, PA-C 11/23/19 1724    Sharman Cheek, MD 11/23/19 2324

## 2019-11-23 NOTE — ED Triage Notes (Signed)
Pt in via POV, reports lower back pain x approximately one week.  Denies any recent injury.  Ambulatory to triage, NAD noted at this time.

## 2020-01-20 ENCOUNTER — Other Ambulatory Visit: Payer: Medicaid Other

## 2020-01-20 ENCOUNTER — Other Ambulatory Visit: Payer: Self-pay

## 2020-01-20 DIAGNOSIS — Z20822 Contact with and (suspected) exposure to covid-19: Secondary | ICD-10-CM

## 2020-01-23 LAB — NOVEL CORONAVIRUS, NAA

## 2020-01-27 ENCOUNTER — Emergency Department
Admission: EM | Admit: 2020-01-27 | Discharge: 2020-01-27 | Disposition: A | Discharge status: Home / Self Care | Payer: Medicaid Other | Attending: Emergency Medicine | Admitting: Emergency Medicine

## 2020-01-27 ENCOUNTER — Encounter: Payer: Self-pay | Admitting: Emergency Medicine

## 2020-01-27 ENCOUNTER — Other Ambulatory Visit: Payer: Self-pay

## 2020-01-27 DIAGNOSIS — R197 Diarrhea, unspecified: Secondary | ICD-10-CM | POA: Diagnosis not present

## 2020-01-27 DIAGNOSIS — R1030 Lower abdominal pain, unspecified: Secondary | ICD-10-CM | POA: Insufficient documentation

## 2020-01-27 DIAGNOSIS — Z79899 Other long term (current) drug therapy: Secondary | ICD-10-CM | POA: Insufficient documentation

## 2020-01-27 LAB — COMPREHENSIVE METABOLIC PANEL
ALT: 14 U/L (ref 0–44)
AST: 19 U/L (ref 15–41)
Albumin: 4.5 g/dL (ref 3.5–5.0)
Alkaline Phosphatase: 56 U/L (ref 38–126)
Anion gap: 11 (ref 5–15)
BUN: 13 mg/dL (ref 6–20)
CO2: 23 mmol/L (ref 22–32)
Calcium: 9.2 mg/dL (ref 8.9–10.3)
Chloride: 106 mmol/L (ref 98–111)
Creatinine, Ser: 1.06 mg/dL (ref 0.61–1.24)
GFR calc Af Amer: >60 mL/min (ref >60)
GFR calc non Af Amer: >60 mL/min (ref >60)
Glucose, Bld: 125 mg/dL — ABNORMAL HIGH (ref 70–99)
Potassium: 3.9 mmol/L (ref 3.5–5.1)
Sodium: 140 mmol/L (ref 135–145)
Total Bilirubin: 0.8 mg/dL (ref 0.3–1.2)
Total Protein: 7.1 g/dL (ref 6.5–8.1)

## 2020-01-27 LAB — URINALYSIS, COMPLETE (UACMP) WITH MICROSCOPIC
Bacteria, UA: NONE SEEN
Bilirubin Urine: NEGATIVE
Glucose, UA: NEGATIVE mg/dL
Hgb urine dipstick: NEGATIVE
Ketones, ur: 20 mg/dL — AB
Leukocytes,Ua: NEGATIVE
Nitrite: NEGATIVE
Protein, ur: NEGATIVE mg/dL
Specific Gravity, Urine: 1.027 (ref 1.005–1.030)
pH: 6 (ref 5.0–8.0)

## 2020-01-27 LAB — CBC
HCT: 41.3 % (ref 39.0–52.0)
Hemoglobin: 13.7 g/dL (ref 13.0–17.0)
MCH: 29.3 pg (ref 26.0–34.0)
MCHC: 33.2 g/dL (ref 30.0–36.0)
MCV: 88.2 fL (ref 80.0–100.0)
Platelets: 182 10*3/uL (ref 150–400)
RBC: 4.68 MIL/uL (ref 4.22–5.81)
RDW: 13.8 % (ref 11.5–15.5)
WBC: 10.8 10*3/uL — ABNORMAL HIGH (ref 4.0–10.5)
nRBC: 0 % (ref 0.0–0.2)

## 2020-01-27 LAB — LIPASE, BLOOD: Lipase: 24 U/L (ref 11–51)

## 2020-01-27 NOTE — ED Provider Notes (Signed)
[facility] Emergency Department Provider Note   ____________________________________________   First MD Initiated Contact with Patient 01/27/20 1535     (approximate)  I have reviewed the triage vital signs and the nursing notes.   HISTORY  Chief Complaint Abdominal Pain    HPI Darrell Hughes is a 29 y.o. male with a stated past medical history of anxiety, cholecystectomy, chronic low back pain, hyperlipidemia, and chronic abdominal pain who presents for bilateral lower quadrant abdominal pain over the last 2 weeks. Patient states that it was the worse this morning and was associated with an episode of nonbloody diarrhea. Patient states that he has been having abdominal pain issues since his gallbladder was removed and is very concerned that this may be the reason he is having abdominal pain and diarrhea today. Patient states that he does have a gastroenterologist that he sees and has had an upper endoscopy that did not show anything significant. Patient describes this pain as a sharp, nonradiating, intermittent, 5/10 pain with no exacerbating factors and partially relieved after defecation.         Past Medical History:  Diagnosis Date  . Anxiety   . Cardiac murmur   . Chest pain   . Cholelithiasis   . Chronic pain   . Dyspnea   . Elevated blood pressure reading   . Family history of adverse reaction to anesthesia   . H. pylori infection   . 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 Hyperlipidemia   . Low back pain   . Murmur, cardiac    as an infant, has resolved  . PONV (postoperative nausea and vomiting)    pt may have eaten prior to surgery.  . Right shoulder pain   . Vitamin D deficiency     Patient Active Problem List   Diagnosis Date Noted  . 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 CHOLECYSTECTOMY N/A 04/30/2017   Procedure: LAPAROSCOPIC CHOLECYSTECTOMY WITH INTRAOPERATIVE  CHOLANGIOGRAM;  Surgeon: Kieth Brightly, MD;  Location: [facility] ORS;  Service: General;  Laterality: N/A;  . FOOT SURGERY Right 1996    from glass  . KNEE ARTHROSCOPY      Prior to Admission medications   Medication Sig Start Date End Date Taking? Authorizing Provider  colestipol (COLESTID) 1 g tablet Take 2 g by mouth 2 (two) times daily. 02/20/18 02/20/19  [provider]  dicyclomine (BENTYL) 20 MG tablet Take 1 tablet (20 mg total) by mouth every 6 (six) hours as needed for spasms. 10/10/19   Raeford Razor, MD  hydrocortisone (PROCTOSOL HC) 2.5 % rectal cream Place 1 application rectally daily. 02/12/18   [provider]  Hyoscyamine Sulfate SL (LEVSIN/SL) 0.125 MG SUBL Place 0.25 mg under the tongue every 4 (four) hours as needed. 08/02/19   Triplett, Rulon Eisenmenger B, FNP  meloxicam (MOBIC) 15 MG tablet Take 1 tablet (15 mg total) by mouth daily. 11/23/19   Cuthriell, Delorise Royals, PA-C  methocarbamol (ROBAXIN) 500 MG tablet Take 1 tablet (500 mg total) by mouth 4 (four) times daily. 11/23/19   Cuthriell, Delorise Royals, PA-C  metoCLOPramide (REGLAN) 5 MG tablet Take 5 mg by mouth 2 (two) times daily. 07/03/18   [provider]  Nitroglycerin 0.4 % OINT Place 1 application rectally 2 (two) times daily. 06/24/18   [provider]  omeprazole (PRILOSEC) 40 MG capsule Take 40 mg by mouth 2 (two) times daily. 02/20/18   [provider]  pantoprazole (PROTONIX) 20 MG tablet Take 1 tablet (20 mg total) by mouth daily. 07/30/18 07/30/19  Jene Every, MD  PROAIR HFA 108 [phone] Base) MCG/ACT inhaler Inhale 2 puffs into the lungs every 4 (four) hours as needed for wheezing. 03/17/18   [provider]  sucralfate (CARAFATE) 1 g tablet Take 1 tablet (1 g total) by mouth 4 (four) times daily. 06/19/19   Phineas Semen, MD  topiramate (TOPAMAX) 25 MG tablet Take one tablet at night for one week, then take 2 tablets at night for one week, then take 3 tablets at night.  09/18/18   York Spaniel, MD    Allergies Adhesive [tape]  Family History  Problem Relation Age of Onset  . Lung cancer Mother        and colon  . Migraines Mother   . Migraines Brother   . Schizophrenia Brother     Social History Social History   Tobacco Use  . Smoking status: Never Smoker  . Smokeless tobacco: Never Used  Vaping Use  . Vaping Use: Former  Substance Use Topics  . Alcohol use: No  . Drug use: No    Review of Systems  Constitutional: No fever/chills Eyes: No visual changes. ENT: No sore throat. Cardiovascular: Denies chest pain. Respiratory: Denies shortness of breath. Gastrointestinal: Endorses abdominal pain.  No nausea, no vomiting. Endorses diarrhea. Genitourinary: Negative for dysuria. Musculoskeletal: Negative for acute arthralgias Skin: Negative for rash. Neurological: Negative for headaches, weakness/numbness/paresthesias in any extremity Psychiatric: Negative for suicidal ideation/homicidal ideation   ____________________________________________   PHYSICAL EXAM:  VITAL SIGNS: ED Triage Vitals  Enc Vitals Group     BP 01/27/20 1253 (!) 167/91     Pulse Rate 01/27/20 1253 60     Resp 01/27/20 1253 16     Temp 01/27/20 1253 98.4 F (36.9 C)     Temp Source 01/27/20 1253 Oral     SpO2 01/27/20 1253 100 %     Weight 01/27/20 1254 260 lb (117.9 kg)     Height 01/27/20 1254 6' (1.829 m)     Head Circumference --      Peak Flow --      Pain Score 01/27/20 1254 8     Pain Loc --      Pain Edu? --      Excl. in GC? --     Constitutional: Alert and oriented. Well appearing and in no acute distress. Eyes: Conjunctivae are normal. PERRL. EOMI. Head: Atraumatic. Nose: No congestion/rhinnorhea. Mouth/Throat: Mucous membranes are moist. Neck: No stridor Cardiovascular: Normal rate, regular rhythm. Grossly normal heart sounds.  Good peripheral circulation. Respiratory: Normal respiratory effort.  No retractions. Gastrointestinal:  Soft, mild bilateral lower abdominal quadrant tenderness to palpation. No distention. Musculoskeletal: No lower extremity tenderness nor edema.  No joint effusions. Neurologic:  Normal speech and language. No gross focal neurologic deficits are appreciated. Skin:  Skin is warm and dry. No rash noted. Psychiatric: Mood and affect are normal. Speech and behavior are normal.  ____________________________________________   LABS (all labs ordered are listed, but only abnormal results are displayed)  Labs Reviewed  COMPREHENSIVE METABOLIC PANEL - Abnormal; Notable for the following components:      Result Value   Glucose, Bld 125 (*)    All other components within normal limits  CBC - Abnormal; Notable for the following components:   WBC 10.8 (*)    All other components within normal limits  URINALYSIS, COMPLETE (UACMP) WITH MICROSCOPIC - Abnormal;  Notable for the following components:   Color, Urine YELLOW (*)    APPearance HAZY (*)    Ketones, ur 20 (*)    All other components within normal limits  LIPASE, BLOOD   _____________________________________   PROCEDURES  Procedure(s) performed (including Critical Care):  Procedures   ____________________________________________   INITIAL IMPRESSION / ASSESSMENT AND PLAN / ED COURSE        Patient presents for abdominal pain.  Differential diagnosis includes appendicitis, abdominal aortic aneurysm, surgical biliary disease, pancreatitis, SBO, mesenteric ischemia, serious intra-abdominal bacterial illness, genital torsion. Doubt atypical ACS. Based on history, physical exam, radiologic/laboratory evaluation, there is no red flag results or symptomatology requiring emergent intervention or need for admission at this time Pt tolerating PO. Disposition: Patient will be discharged with strict return precautions and follow up with primary MD within 12-24 hours for further evaluation. Patient understands that this still may have an  early presentation of an emergent medical condition such as appendicitis that will require a recheck.      ____________________________________________   FINAL CLINICAL IMPRESSION(S) / ED DIAGNOSES  Final diagnoses:  Lower abdominal pain of unknown etiology  Diarrhea, unspecified type     ED Discharge Orders    None       Note:  This document was prepared using Dragon voice recognition software and may include unintentional dictation errors.   Merwyn Katos, MD 01/27/20 [phone]

## 2020-01-27 NOTE — ED Triage Notes (Signed)
Pt in via POV, reports lower abdominal pain, diarrhea x one day.  Pt also reports urinary frequency/urgency.  Vitals WDL.  NAD noted at this time.

## 2020-04-12 ENCOUNTER — Other Ambulatory Visit: Payer: Self-pay

## 2020-04-12 ENCOUNTER — Emergency Department
Admission: EM | Admit: 2020-04-12 | Discharge: 2020-04-12 | Disposition: A | Discharge status: Home / Self Care | Payer: Medicaid Other | Attending: Emergency Medicine | Admitting: Emergency Medicine

## 2020-04-12 DIAGNOSIS — R110 Nausea: Secondary | ICD-10-CM | POA: Diagnosis not present

## 2020-04-12 DIAGNOSIS — R197 Diarrhea, unspecified: Secondary | ICD-10-CM | POA: Insufficient documentation

## 2020-04-12 DIAGNOSIS — R1031 Right lower quadrant pain: Secondary | ICD-10-CM | POA: Insufficient documentation

## 2020-04-12 LAB — CBC
HCT: 42 % (ref 39.0–52.0)
Hemoglobin: 13.9 g/dL (ref 13.0–17.0)
MCH: 29.1 pg (ref 26.0–34.0)
MCHC: 33.1 g/dL (ref 30.0–36.0)
MCV: 88.1 fL (ref 80.0–100.0)
Platelets: 210 10*3/uL (ref 150–400)
RBC: 4.77 MIL/uL (ref 4.22–5.81)
RDW: 14.3 % (ref 11.5–15.5)
WBC: 10.3 10*3/uL (ref 4.0–10.5)
nRBC: 0 % (ref 0.0–0.2)

## 2020-04-12 LAB — URINALYSIS, COMPLETE (UACMP) WITH MICROSCOPIC
Bacteria, UA: NONE SEEN
Bilirubin Urine: NEGATIVE
Glucose, UA: NEGATIVE mg/dL
Hgb urine dipstick: NEGATIVE
Ketones, ur: NEGATIVE mg/dL
Leukocytes,Ua: NEGATIVE
Nitrite: NEGATIVE
Protein, ur: NEGATIVE mg/dL
Specific Gravity, Urine: 1.009 (ref 1.005–1.030)
Squamous Epithelial / HPF: NONE SEEN (ref 0–5)
pH: 6 (ref 5.0–8.0)

## 2020-04-12 LAB — COMPREHENSIVE METABOLIC PANEL
ALT: 19 U/L (ref 0–44)
AST: 20 U/L (ref 15–41)
Albumin: 4.5 g/dL (ref 3.5–5.0)
Alkaline Phosphatase: 58 U/L (ref 38–126)
Anion gap: 8 (ref 5–15)
BUN: 14 mg/dL (ref 6–20)
CO2: 28 mmol/L (ref 22–32)
Calcium: 9.2 mg/dL (ref 8.9–10.3)
Chloride: 103 mmol/L (ref 98–111)
Creatinine, Ser: 0.89 mg/dL (ref 0.61–1.24)
GFR, Estimated: >60 mL/min (ref >60)
Glucose, Bld: 94 mg/dL (ref 70–99)
Potassium: 4.4 mmol/L (ref 3.5–5.1)
Sodium: 139 mmol/L (ref 135–145)
Total Bilirubin: 0.6 mg/dL (ref 0.3–1.2)
Total Protein: 7.2 g/dL (ref 6.5–8.1)

## 2020-04-12 LAB — LIPASE, BLOOD: Lipase: 149 U/L — ABNORMAL HIGH (ref 11–51)

## 2020-04-12 MED ORDER — DICYCLOMINE HCL 20 MG PO TABS
20.0000 mg | ORAL_TABLET | Freq: Four times a day (QID) | ORAL | 0 refills | Status: DC | PRN
Start: 2020-04-12 — End: 2021-02-01

## 2020-04-12 MED ORDER — DICYCLOMINE HCL 10 MG PO CAPS
20.0000 mg | ORAL_CAPSULE | Freq: Once | ORAL | Status: AC
  Administered 2020-04-12: 20 mg via ORAL
  Filled 2020-04-12: qty 2

## 2020-04-12 NOTE — Discharge Instructions (Addendum)
Please seek medical attention for any high fevers, chest pain, shortness of breath, change in behavior, persistent vomiting, bloody stool or any other new or concerning symptoms.  

## 2020-04-12 NOTE — ED Provider Notes (Signed)
[facility] Emergency Department Provider Note  ____________________________________________   I have reviewed the triage vital signs and the nursing notes.   HISTORY  Chief Complaint Abdominal pain  History limited by: Not Limited   HPI Darrell Hughes is a 29 y.o. male who presents to the emergency department today because of concerns for abdominal pain. He states he started having the pain on 24 days ago. Located in the right lower quadrant. States the pain is worse with movement or pressure. He has had some associated diarrhea. He has had some nausea. The patient states he has history of abdominal pain in the past and is being worked up by GI. He says there is some concerns that he might have IBS. Went to urgent care today where they were concerned for possible appendicitis given location of pain and white count of 10.3.   Records reviewed. Per medical record review patient has a history of abdominal pain.   Past Medical History:  Diagnosis Date  . Anxiety   . Cardiac murmur   . Chest pain   . Cholelithiasis   . Chronic pain   . Dyspnea   . Elevated blood pressure reading   . Family history of adverse reaction to anesthesia   . H. pylori infection   . 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 Hyperlipidemia   . Low back pain   . Murmur, cardiac    as an infant, has resolved  . PONV (postoperative nausea and vomiting)    pt may have eaten prior to surgery.  . Right shoulder pain   . Vitamin D deficiency     Patient Active Problem List   Diagnosis Date Noted  . 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 CHOLECYSTECTOMY N/A 04/30/2017   Procedure: LAPAROSCOPIC CHOLECYSTECTOMY WITH INTRAOPERATIVE CHOLANGIOGRAM;  Surgeon: Kieth Brightly, MD;  Location: [facility] ORS;  Service: General;  Laterality: N/A;  . FOOT SURGERY Right 1996    from glass  . KNEE ARTHROSCOPY      Prior to Admission  medications   Medication Sig Start Date End Date Taking? Authorizing Provider  colestipol (COLESTID) 1 g tablet Take 2 g by mouth 2 (two) times daily. 02/20/18 02/20/19  [provider]  dicyclomine (BENTYL) 20 MG tablet Take 1 tablet (20 mg total) by mouth every 6 (six) hours as needed for spasms. 10/10/19   Raeford Razor, MD  hydrocortisone (PROCTOSOL HC) 2.5 % rectal cream Place 1 application rectally daily. 02/12/18   [provider]  Hyoscyamine Sulfate SL (LEVSIN/SL) 0.125 MG SUBL Place 0.25 mg under the tongue every 4 (four) hours as needed. 08/02/19   Triplett, Rulon Eisenmenger B, FNP  meloxicam (MOBIC) 15 MG tablet Take 1 tablet (15 mg total) by mouth daily. 11/23/19   Cuthriell, Delorise Royals, PA-C  methocarbamol (ROBAXIN) 500 MG tablet Take 1 tablet (500 mg total) by mouth 4 (four) times daily. 11/23/19   Cuthriell, Delorise Royals, PA-C  metoCLOPramide (REGLAN) 5 MG tablet Take 5 mg by mouth 2 (two) times daily. 07/03/18   [provider]  Nitroglycerin 0.4 % OINT Place 1 application rectally 2 (two) times daily. 06/24/18   [provider]  omeprazole (PRILOSEC) 40 MG capsule Take 40 mg by mouth 2 (two) times daily. 02/20/18   [provider]  pantoprazole (PROTONIX) 20 MG tablet Take 1 tablet (20 mg total) by mouth daily. 07/30/18 07/30/19  Jene Every, MD  PROAIR HFA 108 [phone] Base)  MCG/ACT inhaler Inhale 2 puffs into the lungs every 4 (four) hours as needed for wheezing. 03/17/18   [provider]  sucralfate (CARAFATE) 1 g tablet Take 1 tablet (1 g total) by mouth 4 (four) times daily. 06/19/19   Phineas Semen, MD  topiramate (TOPAMAX) 25 MG tablet Take one tablet at night for one week, then take 2 tablets at night for one week, then take 3 tablets at night. 09/18/18   York Spaniel, MD    Allergies Adhesive [tape]  Family History  Problem Relation Age of Onset  . Lung cancer Mother        and colon  . Migraines Mother   . Migraines Brother   .  Schizophrenia Brother     Social History Social History   Tobacco Use  . Smoking status: Never Smoker  . Smokeless tobacco: Never Used  Vaping Use  . Vaping Use: Former  Substance Use Topics  . Alcohol use: No  . Drug use: No    Review of Systems Constitutional: No fever/chills Eyes: No visual changes. ENT: No sore throat. Cardiovascular: Denies chest pain. Respiratory: Denies shortness of breath. Gastrointestinal: Positive for abdominal pain.   Genitourinary: Negative for dysuria. Musculoskeletal: Negative for back pain. Skin: Negative for rash. Neurological: Negative for headaches, focal weakness or numbness.  ____________________________________________   PHYSICAL EXAM:  VITAL SIGNS: ED Triage Vitals  Enc Vitals Group     BP 04/12/20 1417 128/67     Pulse Rate 04/12/20 1417 (!) 56     Resp 04/12/20 1417 18     Temp 04/12/20 1417 98.9 F (37.2 C)     Temp src --      SpO2 04/12/20 1417 100 %     Weight 04/12/20 1415 265 lb (120.2 kg)     Height 04/12/20 1415 6' (1.829 m)     Head Circumference --      Peak Flow --      Pain Score 04/12/20 1415 10   Constitutional: Alert and oriented.  Eyes: Conjunctivae are normal.  ENT      Head: Normocephalic and atraumatic.      Nose: No congestion/rhinnorhea.      Mouth/Throat: Mucous membranes are moist.      Neck: No stridor. Hematological/Lymphatic/Immunilogical: No cervical lymphadenopathy. Cardiovascular: Normal rate, regular rhythm.  No murmurs, rubs, or gallops. Respiratory: Normal respiratory effort without tachypnea nor retractions. Breath sounds are clear and equal bilaterally. No wheezes/rales/rhonchi. Gastrointestinal: Soft and tender in the right lower quadrant. Negative Rovsing sign. Genitourinary: Deferred Musculoskeletal: Normal range of motion in all extremities. No lower extremity edema. Neurologic:  Normal speech and language. No gross focal neurologic deficits are appreciated.  Skin:  Skin is  warm, dry and intact. No rash noted. Psychiatric: Mood and affect are normal. Speech and behavior are normal. Patient exhibits appropriate insight and judgment.  ____________________________________________    LABS (pertinent positives/negatives)  Lipase 149 CMP wnl CBC wbc 10.3, hgb 13.9, plt 210 UA clear, RBC and WBC 0-5, none bacteria  ____________________________________________   EKG  None  ____________________________________________    RADIOLOGY  None ____________________________________________   PROCEDURES  Procedures  ____________________________________________   INITIAL IMPRESSION / ASSESSMENT AND PLAN / ED COURSE  Pertinent labs & imaging results that were available during my care of the patient were reviewed by me and considered in my medical decision making (see chart for details).   Patient presented to the emergency department today because of concerns for abdominal pain.  It  has been going on for greater than 3 weeks.  Is located in the right lower quadrant.  On exam he does have some tenderness in the right lower quadrant.  No Rovsing's.  Patient is afebrile here.  No leukocytosis.  I had a discussion with the patient.  While he was sent here for concerns for appendicitis I believe appendicitis is quite unlikely.  Given the 3+ week history of the pain and lack of leukocytosis I would think significant intra-abdominal infection would be unlikely.  I discussed this with the patient.  He additionally has history of abdominal pain in the past.  Did offer CT to evaluate for appendicitis however patient was comfortable deferring at this time which I think is completely reasonable. Patient did feel better after medication here.  Patient states he has follow-up already scheduled tomorrow with GI.   ____________________________________________   FINAL CLINICAL IMPRESSION(S) / ED DIAGNOSES  Final diagnoses:  Right lower quadrant abdominal pain     Note:  This dictation was prepared with Dragon dictation. Any transcriptional errors that result from this process are unintentional     Phineas Semen, MD 04/12/20 1645

## 2020-04-12 NOTE — ED Triage Notes (Signed)
Pt comes via POV from home with c/o RLQ pain. Pt states that this started earlier this month and has gotten worse. Pt states he was sent here to rule out appendicitis  Pt states some nausea.

## 2020-10-12 ENCOUNTER — Other Ambulatory Visit: Payer: Self-pay

## 2020-10-12 ENCOUNTER — Emergency Department
Admission: EM | Admit: 2020-10-12 | Discharge: 2020-10-12 | Disposition: A | Discharge status: Home / Self Care | Payer: Medicaid Other | Attending: Emergency Medicine | Admitting: Emergency Medicine

## 2020-10-12 ENCOUNTER — Emergency Department: Payer: Medicaid Other

## 2020-10-12 ENCOUNTER — Encounter: Payer: Self-pay | Admitting: Emergency Medicine

## 2020-10-12 DIAGNOSIS — S161XXA Strain of muscle, fascia and tendon at neck level, initial encounter: Secondary | ICD-10-CM | POA: Diagnosis not present

## 2020-10-12 DIAGNOSIS — Z79899 Other long term (current) drug therapy: Secondary | ICD-10-CM | POA: Diagnosis not present

## 2020-10-12 DIAGNOSIS — Y93E2 Activity, laundry: Secondary | ICD-10-CM | POA: Insufficient documentation

## 2020-10-12 DIAGNOSIS — S0003XA Contusion of scalp, initial encounter: Secondary | ICD-10-CM | POA: Insufficient documentation

## 2020-10-12 DIAGNOSIS — W208XXA Other cause of strike by thrown, projected or falling object, initial encounter: Secondary | ICD-10-CM | POA: Diagnosis not present

## 2020-10-12 DIAGNOSIS — S0990XA Unspecified injury of head, initial encounter: Secondary | ICD-10-CM | POA: Diagnosis present

## 2020-10-12 MED ORDER — KETOROLAC TROMETHAMINE 10 MG PO TABS: 10.0000 mg | ORAL_TABLET | Freq: Four times a day (QID) | ORAL | 0 refills | Status: DC | PRN

## 2020-10-12 MED ORDER — OXYCODONE-ACETAMINOPHEN 7.5-325 MG PO TABS: 1.0000 | ORAL_TABLET | Freq: Four times a day (QID) | ORAL | 0 refills | Status: AC | PRN

## 2020-10-12 MED ORDER — HYDROMORPHONE HCL 1 MG/ML IJ SOLN
1.0000 mg | Freq: Once | INTRAMUSCULAR | Status: AC
Start: 2020-10-12 — End: 2020-10-12
  Administered 2020-10-12: 1 mg via INTRAMUSCULAR
  Filled 2020-10-12: qty 1

## 2020-10-12 MED ORDER — ORPHENADRINE CITRATE ER 100 MG PO TB12: 100.0000 mg | ORAL_TABLET | Freq: Two times a day (BID) | ORAL | 0 refills | Status: DC

## 2020-10-12 MED ORDER — KETOROLAC TROMETHAMINE 60 MG/2ML IM SOLN
60.0000 mg | Freq: Once | INTRAMUSCULAR | Status: AC
  Administered 2020-10-12: 60 mg via INTRAMUSCULAR
  Filled 2020-10-12: qty 2

## 2020-10-12 MED ORDER — ORPHENADRINE CITRATE 30 MG/ML IJ SOLN
60.0000 mg | Freq: Two times a day (BID) | INTRAMUSCULAR | Status: DC
  Administered 2020-10-12: 60 mg via INTRAMUSCULAR
  Filled 2020-10-12: qty 2

## 2020-10-12 NOTE — ED Notes (Signed)
Patient returned to ED from CT.

## 2020-10-12 NOTE — ED Notes (Signed)
Patient and mother updated on POC.

## 2020-10-12 NOTE — Discharge Instructions (Signed)
No acute findings CT of the head and neck.  Read and follow discharge care instructions.  Be advised pain medication may cause drowsiness.

## 2020-10-12 NOTE — ED Provider Notes (Signed)
[facility] Emergency Department Provider Note   ____________________________________________   Event Date/Time   First MD Initiated Contact with Patient 10/12/20 1358     (approximate)  I have reviewed the triage vital signs and the nursing notes.   HISTORY  Chief Complaint Head Injury    HPI Darrell Hughes is a 30 y.o. male patient complain severe headache, neck pain, and bilateral shoulder pain.  Patient states that a cabinet fell off the wall hit him on top of the head.  Patient denies LOC.  No laceration or hematoma was noted.  Patient rates pain as a 9/10.  Patient described pain as "achy".  No palliative measures prior to arrival.         Past Medical History:  Diagnosis Date  . Anxiety   . Cardiac murmur   . Chest pain   . Cholelithiasis   . Chronic pain   . Dyspnea   . Elevated blood pressure reading   . Family history of adverse reaction to anesthesia   . H. pylori infection   . 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 Hyperlipidemia   . Low back pain   . Murmur, cardiac    as an infant, has resolved  . PONV (postoperative nausea and vomiting)    pt may have eaten prior to surgery.  . Right shoulder pain   . Vitamin D deficiency     Patient Active Problem List   Diagnosis Date Noted  . 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 CHOLECYSTECTOMY N/A 04/30/2017   Procedure: LAPAROSCOPIC CHOLECYSTECTOMY WITH INTRAOPERATIVE CHOLANGIOGRAM;  Surgeon: Kieth Brightly, MD;  Location: [facility] ORS;  Service: General;  Laterality: N/A;  . FOOT SURGERY Right 1996    from glass  . KNEE ARTHROSCOPY      Prior to Admission medications   Medication Sig Start Date End Date Taking? Authorizing Provider  ketorolac (TORADOL) 10 MG tablet Take 1 tablet (10 mg total) by mouth every 6 (six) hours as needed. 10/12/20  Yes Joni Reining, PA-C  orphenadrine (NORFLEX) 100 MG tablet Take 1 tablet (100  mg total) by mouth 2 (two) times daily. 10/12/20  Yes Joni Reining, PA-C  oxyCODONE-acetaminophen (PERCOCET) 7.5-325 MG tablet Take 1 tablet by mouth every 6 (six) hours as needed for up to 5 days. 10/12/20 10/17/20 Yes Joni Reining, PA-C  colestipol (COLESTID) 1 g tablet Take 2 g by mouth 2 (two) times daily. 02/20/18 02/20/19  [provider]  dicyclomine (BENTYL) 20 MG tablet Take 1 tablet (20 mg total) by mouth every 6 (six) hours as needed (abdominal pain). 04/12/20   Phineas Semen, MD  hydrocortisone (PROCTOSOL HC) 2.5 % rectal cream Place 1 application rectally daily. 02/12/18   [provider]  Hyoscyamine Sulfate SL (LEVSIN/SL) 0.125 MG SUBL Place 0.25 mg under the tongue every 4 (four) hours as needed. 08/02/19   Triplett, Rulon Eisenmenger B, FNP  metoCLOPramide (REGLAN) 5 MG tablet Take 5 mg by mouth 2 (two) times daily. 07/03/18   [provider]  Nitroglycerin 0.4 % OINT Place 1 application rectally 2 (two) times daily. 06/24/18   [provider]  omeprazole (PRILOSEC) 40 MG capsule Take 40 mg by mouth 2 (two) times daily. 02/20/18   [provider]  pantoprazole (PROTONIX) 20 MG tablet Take 1 tablet (20 mg total) by mouth daily. 07/30/18 07/30/19  Jene Every, MD  PROAIR HFA 108 [phone] Base) MCG/ACT inhaler Inhale  2 puffs into the lungs every 4 (four) hours as needed for wheezing. 03/17/18   [provider]  sucralfate (CARAFATE) 1 g tablet Take 1 tablet (1 g total) by mouth 4 (four) times daily. 06/19/19   Phineas Semen, MD  topiramate (TOPAMAX) 25 MG tablet Take one tablet at night for one week, then take 2 tablets at night for one week, then take 3 tablets at night. 09/18/18   York Spaniel, MD    Allergies Adhesive [tape]  Family History  Problem Relation Age of Onset  . Lung cancer Mother        and colon  . Migraines Mother   . Migraines Brother   . Schizophrenia Brother     Social History Social History   Tobacco Use  .  Smoking status: Never Smoker  . Smokeless tobacco: Never Used  Vaping Use  . Vaping Use: Former  Substance Use Topics  . Alcohol use: No  . Drug use: No    Review of Systems Constitutional: No fever/chills Eyes: No visual changes. ENT: No sore throat. Cardiovascular: Denies chest pain. Respiratory: Denies shortness of breath. Gastrointestinal: No abdominal pain.  No nausea, no vomiting.  No diarrhea.  No constipation. Genitourinary: Negative for dysuria. Musculoskeletal: Negative for back pain. Skin: Negative for rash. Neurological: Positive for headaches, but denies focal weakness or numbness. *Psychiatric:  Anxiety Endocrine:  Hyperlipidemia Hematological/Lymphatic:  Allergic/Immunilogical: Adhesive tape. ____________________________________________   PHYSICAL EXAM:  VITAL SIGNS: ED Triage Vitals  Enc Vitals Group     BP 10/12/20 1320 (!) 155/88     Pulse Rate 10/12/20 1320 82     Resp 10/12/20 1320 18     Temp --      Temp src --      SpO2 10/12/20 1320 96 %     Weight 10/12/20 1323 267 lb (121.1 kg)     Height 10/12/20 1323 6' (1.829 m)     Head Circumference --      Peak Flow --      Pain Score 10/12/20 1323 9     Pain Loc --      Pain Edu? --      Excl. in GC? --     Constitutional: Alert and oriented. Well appearing and in no acute distress. Eyes: Conjunctivae are normal. PERRL. EOMI. Head: Atraumatic. Nose: No congestion/rhinnorhea. Mouth/Throat: Mucous membranes are moist.  Oropharynx non-erythematous. Neck:  cervical spine tenderness to palpation C4-C6. Hematological/Lymphatic/Immunilogical: No cervical lymphadenopathy. Cardiovascular: Normal rate, regular rhythm. Grossly normal heart sounds.  Good peripheral circulation.  Elevated blood pressure. Respiratory: Normal respiratory effort.  No retractions. Lungs CTAB. Musculoskeletal: No lower extremity tenderness nor edema.  No joint effusions. Neurologic:  Normal speech and language. No gross focal  neurologic deficits are appreciated. No gait instability. Skin:  Skin is warm, dry and intact. No rash noted. Psychiatric: Mood and affect are normal. Speech and behavior are normal.  ____________________________________________   LABS (all labs ordered are listed, but only abnormal results are displayed)  Labs Reviewed - No data to display ____________________________________________  EKG   ____________________________________________  RADIOLOGY I, Joni Reining, personally viewed and evaluated these images (plain radiographs) as part of my medical decision making, as well as reviewing the written report by the radiologist.  ED MD interpretation:    Official radiology report(s): CT Head Wo Contrast  Result Date: 10/12/2020 CLINICAL DATA:  Head injury.  Cabinet fell on head EXAM: CT HEAD WITHOUT CONTRAST CT CERVICAL SPINE WITHOUT CONTRAST  TECHNIQUE: Multidetector CT imaging of the head and cervical spine was performed following the standard protocol without intravenous contrast. Multiplanar CT image reconstructions of the cervical spine were also generated. COMPARISON:  MRI head 10/13/2018 FINDINGS: CT HEAD FINDINGS Brain: No evidence of acute infarction, hemorrhage, hydrocephalus, extra-axial collection or mass lesion/mass effect. Vascular: Negative for hyperdense vessel Skull: Negative Sinuses/Orbits: Negative Other: None CT CERVICAL SPINE FINDINGS Alignment: Normal Skull base and vertebrae: Negative for fracture Soft tissues and spinal canal: Negative Disc levels:  No significant disc or facet degeneration. Upper chest: Lung apices clear bilaterally. Other: None IMPRESSION: Negative CT head and cervical spine. Electronically Signed   By: Marlan Palau M.D.   On: 10/12/2020 15:00   CT Cervical Spine Wo Contrast  Result Date: 10/12/2020 CLINICAL DATA:  Head injury.  Cabinet fell on head EXAM: CT HEAD WITHOUT CONTRAST CT CERVICAL SPINE WITHOUT CONTRAST TECHNIQUE: Multidetector CT imaging  of the head and cervical spine was performed following the standard protocol without intravenous contrast. Multiplanar CT image reconstructions of the cervical spine were also generated. COMPARISON:  MRI head 10/13/2018 FINDINGS: CT HEAD FINDINGS Brain: No evidence of acute infarction, hemorrhage, hydrocephalus, extra-axial collection or mass lesion/mass effect. Vascular: Negative for hyperdense vessel Skull: Negative Sinuses/Orbits: Negative Other: None CT CERVICAL SPINE FINDINGS Alignment: Normal Skull base and vertebrae: Negative for fracture Soft tissues and spinal canal: Negative Disc levels:  No significant disc or facet degeneration. Upper chest: Lung apices clear bilaterally. Other: None IMPRESSION: Negative CT head and cervical spine. Electronically Signed   By: Marlan Palau M.D.   On: 10/12/2020 15:00    ____________________________________________   PROCEDURES  Procedure(s) performed (including Critical Care):  Procedures   ____________________________________________   INITIAL IMPRESSION / ASSESSMENT AND PLAN / ED COURSE  As part of my medical decision making, I reviewed the following data within the electronic MEDICAL RECORD NUMBER         Patient presents with head and neck pain secondary to contusion by wall And fell on top of his head.  There was no LOC.  Discussed no acute findings on CT of the head and neck.  Patient complaining physical exam consistent with scalp contusion and cervical strain.  Patient given discharge care instructions and a work note.  Patient advised on drug effects of pain medication with muscle relaxer.  Patient vies follow-up PCP or return back to ED if condition worsens.      ____________________________________________   FINAL CLINICAL IMPRESSION(S) / ED DIAGNOSES  Final diagnoses:  Injury of head, initial encounter  Contusion of scalp, initial encounter  Cervical strain, acute, initial encounter     ED Discharge Orders         Ordered     oxyCODONE-acetaminophen (PERCOCET) 7.5-325 MG tablet  Every 6 hours PRN        10/12/20 1534    orphenadrine (NORFLEX) 100 MG tablet  2 times daily        10/12/20 1534    ketorolac (TORADOL) 10 MG tablet  Every 6 hours PRN        10/12/20 1534           Note:  This document was prepared using Dragon voice recognition software and may include unintentional dictation errors.    Joni Reining, PA-C 10/12/20 1537    Minna Antis, MD 10/14/20 [phone]

## 2020-10-12 NOTE — ED Notes (Signed)
See triage note  Presents with head and neck pain  States he had a cabinet fall on his head   Denies any LOC

## 2020-10-12 NOTE — ED Notes (Signed)
Family to nurse's station to ask for an update, family updated as to patient's status.

## 2020-10-12 NOTE — ED Triage Notes (Signed)
Pt comes into the ED via POV c/o head injury.  Pt was working on his washer when the cabinet above him that hold his laundry detergent fell off the wall and hit him on the top of the head.  Pt denies any LOC or blood thinners.  No laceration or hematoma present.  Pt neurologically intact at this time.

## 2020-10-12 NOTE — ED Notes (Addendum)
Patient reports improvement to pain. Patient denies pain, itchng or swelling at injection sites. Patient discharged to POV with mother. Mother reports driving home.

## 2020-10-12 NOTE — ED Notes (Signed)
Patient transported to CT 

## 2020-12-30 ENCOUNTER — Emergency Department: Payer: Medicaid Other

## 2020-12-30 ENCOUNTER — Emergency Department
Admission: EM | Admit: 2020-12-30 | Discharge: 2020-12-30 | Disposition: A | Discharge status: Home / Self Care | Payer: Medicaid Other | Attending: Emergency Medicine | Admitting: Emergency Medicine

## 2020-12-30 ENCOUNTER — Encounter: Payer: Self-pay | Admitting: Emergency Medicine

## 2020-12-30 ENCOUNTER — Other Ambulatory Visit: Payer: Self-pay

## 2020-12-30 DIAGNOSIS — Z87891 Personal history of nicotine dependence: Secondary | ICD-10-CM | POA: Diagnosis not present

## 2020-12-30 DIAGNOSIS — K8500 Idiopathic acute pancreatitis without necrosis or infection: Secondary | ICD-10-CM | POA: Diagnosis not present

## 2020-12-30 DIAGNOSIS — R1033 Periumbilical pain: Secondary | ICD-10-CM | POA: Diagnosis present

## 2020-12-30 DIAGNOSIS — R0602 Shortness of breath: Secondary | ICD-10-CM | POA: Diagnosis not present

## 2020-12-30 LAB — URINALYSIS, COMPLETE (UACMP) WITH MICROSCOPIC
Bacteria, UA: NONE SEEN
Bilirubin Urine: NEGATIVE
Glucose, UA: NEGATIVE mg/dL
Hgb urine dipstick: NEGATIVE
Ketones, ur: NEGATIVE mg/dL
Leukocytes,Ua: NEGATIVE
Nitrite: NEGATIVE
Protein, ur: NEGATIVE mg/dL
Specific Gravity, Urine: 1.027 (ref 1.005–1.030)
Squamous Epithelial / HPF: NONE SEEN (ref 0–5)
pH: 5 (ref 5.0–8.0)

## 2020-12-30 LAB — COMPREHENSIVE METABOLIC PANEL
ALT: 17 U/L (ref 0–44)
AST: 23 U/L (ref 15–41)
Albumin: 4.4 g/dL (ref 3.5–5.0)
Alkaline Phosphatase: 61 U/L (ref 38–126)
Anion gap: 9 (ref 5–15)
BUN: 11 mg/dL (ref 6–20)
CO2: 24 mmol/L (ref 22–32)
Calcium: 9 mg/dL (ref 8.9–10.3)
Chloride: 104 mmol/L (ref 98–111)
Creatinine, Ser: 0.88 mg/dL (ref 0.61–1.24)
GFR, Estimated: >60 mL/min (ref >60)
Glucose, Bld: 114 mg/dL — ABNORMAL HIGH (ref 70–99)
Potassium: 3.9 mmol/L (ref 3.5–5.1)
Sodium: 137 mmol/L (ref 135–145)
Total Bilirubin: 0.6 mg/dL (ref 0.3–1.2)
Total Protein: 7.2 g/dL (ref 6.5–8.1)

## 2020-12-30 LAB — CBC
HCT: 42.2 % (ref 39.0–52.0)
Hemoglobin: 14 g/dL (ref 13.0–17.0)
MCH: 29.4 pg (ref 26.0–34.0)
MCHC: 33.2 g/dL (ref 30.0–36.0)
MCV: 88.7 fL (ref 80.0–100.0)
Platelets: 231 10*3/uL (ref 150–400)
RBC: 4.76 MIL/uL (ref 4.22–5.81)
RDW: 14 % (ref 11.5–15.5)
WBC: 12 10*3/uL — ABNORMAL HIGH (ref 4.0–10.5)
nRBC: 0 % (ref 0.0–0.2)

## 2020-12-30 LAB — LIPASE, BLOOD: Lipase: 85 U/L — ABNORMAL HIGH (ref 11–51)

## 2020-12-30 MED ORDER — OXYCODONE HCL 5 MG PO TABS
5.0000 mg | ORAL_TABLET | Freq: Once | ORAL | Status: AC
  Administered 2020-12-30: 5 mg via ORAL
  Filled 2020-12-30: qty 1

## 2020-12-30 MED ORDER — ONDANSETRON 4 MG PO TBDP: 4.0000 mg | ORAL_TABLET | Freq: Three times a day (TID) | ORAL | 0 refills | Status: DC | PRN

## 2020-12-30 MED ORDER — ONDANSETRON 4 MG PO TBDP
4.0000 mg | ORAL_TABLET | Freq: Once | ORAL | Status: AC
  Administered 2020-12-30: 4 mg via ORAL
  Filled 2020-12-30: qty 1

## 2020-12-30 MED ORDER — ACETAMINOPHEN 500 MG PO TABS
1000.0000 mg | ORAL_TABLET | Freq: Once | ORAL | Status: AC
  Administered 2020-12-30: 1000 mg via ORAL
  Filled 2020-12-30: qty 2

## 2020-12-30 MED ORDER — OXYCODONE HCL 5 MG PO TABS: 5.0000 mg | ORAL_TABLET | Freq: Three times a day (TID) | ORAL | 0 refills | Status: DC | PRN

## 2020-12-30 NOTE — ED Triage Notes (Signed)
Pt comes into the ED via POV c/o LUQ abd pain with some nausea and diarrhea x 2 weeks.  Pt denies any history of kidney stones, and patient denies any episodes of emesis.  Pt in NAD at this time with even and unlabored respirations.

## 2020-12-30 NOTE — ED Provider Notes (Signed)
[facility] Emergency Department Provider Note ____________________________________________   Event Date/Time   First MD Initiated Contact with Patient 12/30/20 1527     (approximate)  I have reviewed the triage vital signs and the nursing notes.  HISTORY  Chief Complaint Abdominal Pain   HPI Darrell Hughes is a 30 y.o. malewho presents to the ED for evaluation of abdominal pain.  Chart review indicates history of obesity, cholecystectomy.   Patient presents to the ED for evaluation of 1 week of worsening left-sided abdominal pain.  He reports pain extending from his periumbilical abdomen towards the left to his LUQ.  Reports the pain has been constant for the past 1 week, and acutely worsening for the past 3 days or so.  Reports poor p.o. intake and lack of appetite with 2-3 episodes of nonbloody nonbilious emesis throughout this time.  Reports a sensation of shortness of breath due to worsening LUQ/left basilar chest pain with deep inspiration.  Denies cough or productive cough.  Denies fevers, syncopal episodes, stool changes.   Denies alcohol intake and reports it has been years since his last alcoholic beverage.  Past Medical History:  Diagnosis Date   Anxiety    Cardiac murmur    Chest pain    Cholelithiasis    Chronic pain    Dyspnea    Elevated blood pressure reading    Family history of adverse reaction to anesthesia    H. 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 Authorizing Provider  ondansetron (ZOFRAN ODT) 4 MG disintegrating tablet Take 1 tablet (4 mg total) by mouth every 8 (eight) hours as needed for nausea or vomiting. 12/30/20  Yes Delton Prairie, MD  oxyCODONE (ROXICODONE) 5 MG immediate release tablet Take 1 tablet (5 mg total) by mouth every 8 (eight) hours as needed for breakthrough pain or severe pain. 12/30/20 12/30/21 Yes Delton Prairie, MD  colestipol (COLESTID) 1 g tablet Take 2 g by mouth 2 (two) times daily. 02/20/18 02/20/19  [provider]  dicyclomine (BENTYL) 20 MG tablet Take 1 tablet (20 mg total) by mouth every 6 (six) hours as needed (abdominal pain). 04/12/20   Phineas Semen, MD  hydrocortisone (PROCTOSOL HC) 2.5 % rectal cream Place 1 application rectally daily. 02/12/18   [provider]  Hyoscyamine Sulfate SL (LEVSIN/SL) 0.125 MG SUBL Place 0.25 mg under the tongue every 4 (four) hours as needed. 08/02/19   Triplett, Rulon Eisenmenger B, FNP  ketorolac (TORADOL) 10 MG tablet Take 1 tablet (10 mg total) by mouth every 6 (six) hours as needed. 10/12/20   Joni Reining, PA-C  metoCLOPramide (REGLAN) 5 MG tablet Take 5 mg by mouth  2 (two) times daily. 07/03/18   [provider]  Nitroglycerin 0.4 % OINT Place 1 application rectally 2 (two) times daily. 06/24/18   [provider]  omeprazole (PRILOSEC) 40 MG capsule Take 40 mg by mouth 2 (two) times daily. 02/20/18   [provider]  orphenadrine (NORFLEX) 100 MG tablet Take 1 tablet (100 mg total) by mouth 2 (two) times daily. 10/12/20   Joni Reining, PA-C  pantoprazole (PROTONIX) 20 MG tablet Take 1 tablet (20 mg total) by mouth daily. 07/30/18 07/30/19  Jene Every, MD  PROAIR HFA [phone] Base) MCG/ACT  inhaler Inhale 2 puffs into the lungs every 4 (four) hours as needed for wheezing. 03/17/18   [provider]  sucralfate (CARAFATE) 1 g tablet Take 1 tablet (1 g total) by mouth 4 (four) times daily. 06/19/19   Phineas Semen, MD  topiramate (TOPAMAX) 25 MG tablet Take one tablet at night for one week, then take 2 tablets at night for one week, then take 3 tablets at night. 09/18/18   York Spaniel, MD    Allergies Adhesive [tape]  Family History  Problem Relation Age of Onset   Lung cancer Mother        and colon   Migraines Mother    Migraines Brother    Schizophrenia Brother     Social History Social History   Tobacco Use   Smoking status: Never   Smokeless tobacco: Never  Vaping Use   Vaping Use: Former  Substance Use Topics   Alcohol use: No   Drug use: No    Review of Systems  Constitutional: No fever/chills Eyes: No visual changes. ENT: No sore throat. Cardiovascular: Denies chest pain. Respiratory: Denies shortness of breath. Gastrointestinal: Positive for abdominal pain, nausea and vomiting.   No diarrhea.  No constipation. Genitourinary: Negative for dysuria. Musculoskeletal: Negative for back pain. Skin: Negative for rash. Neurological: Negative for headaches, focal weakness or numbness.  ____________________________________________   PHYSICAL EXAM:  VITAL SIGNS: Vitals:   12/30/20 1229 12/30/20 1539  BP: 138/88 132/79  Pulse: (!) 59   Resp: 18   Temp: 98 F (36.7 C)   SpO2: 100%      Constitutional: Alert and oriented. Well appearing and in no acute distress. Eyes: Conjunctivae are normal. PERRL. EOMI. Head: Atraumatic. Nose: No congestion/rhinnorhea. Mouth/Throat: Mucous membranes are moist.  Oropharynx non-erythematous. Neck: No stridor. No cervical spine tenderness to palpation. Cardiovascular: Normal rate, regular rhythm. Grossly normal heart sounds.  Good peripheral circulation. Respiratory: Normal respiratory effort.  No  retractions. Lungs CTAB. Gastrointestinal: Soft , nondistended,. No CVA tenderness. Left-sided and LUQ abdominal tenderness without peritoneal features.  Abdomen is otherwise benign. Musculoskeletal: No lower extremity tenderness nor edema.  No joint effusions. No signs of acute trauma. Neurologic:  Normal speech and language. No gross focal neurologic deficits are appreciated. No gait instability noted. Skin:  Skin is warm, dry and intact. No rash noted. Psychiatric: Mood and affect are normal. Speech and behavior are normal.  ____________________________________________   LABS (all labs ordered are listed, but only abnormal results are displayed)  Labs Reviewed  LIPASE, BLOOD - Abnormal; Notable for the following components:      Result Value   Lipase 85 (*)    All other components within normal limits  COMPREHENSIVE METABOLIC PANEL - Abnormal; Notable for the following components:   Glucose, Bld 114 (*)    All other components within normal limits  CBC - Abnormal; Notable for the following  components:   WBC 12.0 (*)    All other components within normal limits  URINALYSIS, COMPLETE (UACMP) WITH MICROSCOPIC - Abnormal; Notable for the following components:   Color, Urine YELLOW (*)    APPearance CLEAR (*)    All other components within normal limits   ____________________________________________  12 Lead EKG  Sinus rhythm the rate of 55 bpm.  Normal axis and intervals.  No evidence of acute ischemia. ____________________________________________  RADIOLOGY  ED MD interpretation: 2 view CXR reviewed by me without evidence of acute cardiopulmonary pathology.  Official radiology report(s): CT ABDOMEN PELVIS WO CONTRAST  Result Date: 12/30/2020 CLINICAL DATA:  Left upper quadrant pain with diarrhea EXAM: CT ABDOMEN AND PELVIS WITHOUT CONTRAST TECHNIQUE: Multidetector CT imaging of the abdomen and pelvis was performed following the standard protocol without IV contrast.  COMPARISON:  CT 10/10/2019 FINDINGS: Lower chest: No acute abnormality. Hepatobiliary: No focal liver abnormality is seen. Status post cholecystectomy. No biliary dilatation. Pancreas: Unremarkable. No pancreatic ductal dilatation or surrounding inflammatory changes. Spleen: Normal in size without focal abnormality. Adrenals/Urinary Tract: Adrenal glands are unremarkable. Kidneys are normal, without renal calculi, focal lesion, or hydronephrosis. Bladder is unremarkable. Stomach/Bowel: Stomach is within normal limits. Appendix appears normal. No evidence of bowel wall thickening, distention, or inflammatory changes. Vascular/Lymphatic: No significant vascular findings are present. No enlarged abdominal or pelvic lymph nodes. Reproductive: Prostate is unremarkable. Other: No abdominal wall hernia or abnormality. No abdominopelvic ascites. Musculoskeletal: No acute or significant osseous findings. IMPRESSION: No CT evidence for acute intra-abdominal or pelvic abnormality. Electronically Signed   By: Jasmine Pang M.D.   On: 12/30/2020 16:15   DG Chest 2 View  Result Date: 12/30/2020 CLINICAL DATA:  Chest pain short of breath EXAM: CHEST - 2 VIEW COMPARISON:  11/01/2018 FINDINGS: The heart size and mediastinal contours are within normal limits. Both lungs are clear. The visualized skeletal structures are unremarkable. IMPRESSION: No active cardiopulmonary disease. Electronically Signed   By: Jasmine Pang M.D.   On: 12/30/2020 16:26    ____________________________________________   PROCEDURES and INTERVENTIONS  Procedure(s) performed (including Critical Care):  .1-3 Lead EKG Interpretation  Date/Time: 12/30/2020 4:06 PM Performed by: Delton Prairie, MD Authorized by: Delton Prairie, MD     Interpretation: normal     ECG rate:  58   ECG rate assessment: normal     Rhythm: sinus bradycardia     Ectopy: none     Conduction: normal    Medications  ondansetron (ZOFRAN-ODT) disintegrating tablet 4 mg (4  mg Oral Given 12/30/20 1607)  acetaminophen (TYLENOL) tablet 1,000 mg (1,000 mg Oral Given 12/30/20 1607)  oxyCODONE (Oxy IR/ROXICODONE) immediate release tablet 5 mg (5 mg Oral Given 12/30/20 1607)    ____________________________________________   MDM / ED COURSE   30 year old male presents to the ED with evidence of mild acute idiopathic pancreatitis amenable to outpatient management.  Normal vitals.  Exam with mild tenderness to left-sided abdomen and LUQ without peritoneal features or guarding.  He otherwise looks well.  No evidence of coexisting pseudocyst or infectious pathology to his pancreas.  Mild elevation of lipase to the 80s on blood work.  EKG is nonischemic and CXR without evidence of acute cardiopulmonary pathology.  No barriers to outpatient management this time.  We will discharge with symptomatic measures and return precautions for the ED.  Clinical Course as of 12/30/20 1644  Fri Dec 30, 2020  1605 Discussed with the patient my concern for acute idiopathic pancreatitis.  We discussed CT  imaging to assess for associated complications as well as CXR to ensure no cardiopulmonary pathology, though low suspicion for this.  We discussed symptomatic measures and treatment for pancreatitis. [DS]  1634 Reassessed.  Patient reports improving symptoms.  Improved examination with lesser tenderness and certainly no peritoneal or concerning features.  No guarding.  We discussed management of pancreatitis at home and we discussed return precautions for the ED. [DS]    Clinical Course User Index [DS] Delton PrairieSmith, Gertie Broerman, MD    ____________________________________________   FINAL CLINICAL IMPRESSION(S) / ED DIAGNOSES  Final diagnoses:  Idiopathic acute pancreatitis without infection or necrosis     ED Discharge Orders          Ordered    ondansetron (ZOFRAN ODT) 4 MG disintegrating tablet  Every 8 hours PRN        12/30/20 1631    oxyCODONE (ROXICODONE) 5 MG immediate release tablet   Every 8 hours PRN        12/30/20 1631             Chloe Baig   Note:  This document was prepared using Dragon voice recognition software and may include unintentional dictation errors.    Delton PrairieSmith, Cher Egnor, MD 12/30/20 [phone]

## 2020-12-30 NOTE — Discharge Instructions (Addendum)
Use Tylenol for pain and fevers.  Up to 1000 mg per dose, up to 4 times per day.  Do not take more than 4000 mg of Tylenol/acetaminophen within 24 hours..  Use naproxen/Aleve for anti-inflammatory pain relief. Use up to 500mg every 12 hours. Do not take more frequently than this. Do not use other NSAIDs (ibuprofen, Advil) while taking this medication. It is safe to take Tylenol with this.   

## 2021-02-01 ENCOUNTER — Emergency Department
Admission: EM | Admit: 2021-02-01 | Discharge: 2021-02-01 | Disposition: A | Discharge status: Home / Self Care | Payer: Medicaid Other | Attending: Emergency Medicine | Admitting: Emergency Medicine

## 2021-02-01 ENCOUNTER — Other Ambulatory Visit: Payer: Self-pay

## 2021-02-01 ENCOUNTER — Emergency Department: Payer: Medicaid Other

## 2021-02-01 DIAGNOSIS — R197 Diarrhea, unspecified: Secondary | ICD-10-CM | POA: Diagnosis not present

## 2021-02-01 DIAGNOSIS — R1084 Generalized abdominal pain: Secondary | ICD-10-CM | POA: Diagnosis not present

## 2021-02-01 DIAGNOSIS — R110 Nausea: Secondary | ICD-10-CM | POA: Insufficient documentation

## 2021-02-01 DIAGNOSIS — R6883 Chills (without fever): Secondary | ICD-10-CM | POA: Diagnosis not present

## 2021-02-01 LAB — CBC
HCT: 41.9 % (ref 39.0–52.0)
Hemoglobin: 14.3 g/dL (ref 13.0–17.0)
MCH: 30.1 pg (ref 26.0–34.0)
MCHC: 34.1 g/dL (ref 30.0–36.0)
MCV: 88.2 fL (ref 80.0–100.0)
Platelets: 225 10*3/uL (ref 150–400)
RBC: 4.75 MIL/uL (ref 4.22–5.81)
RDW: 13.6 % (ref 11.5–15.5)
WBC: 11.8 10*3/uL — ABNORMAL HIGH (ref 4.0–10.5)
nRBC: 0 % (ref 0.0–0.2)

## 2021-02-01 LAB — COMPREHENSIVE METABOLIC PANEL
ALT: 19 U/L (ref 0–44)
AST: 22 U/L (ref 15–41)
Albumin: 4.5 g/dL (ref 3.5–5.0)
Alkaline Phosphatase: 66 U/L (ref 38–126)
Anion gap: 10 (ref 5–15)
BUN: 13 mg/dL (ref 6–20)
CO2: 27 mmol/L (ref 22–32)
Calcium: 9.5 mg/dL (ref 8.9–10.3)
Chloride: 103 mmol/L (ref 98–111)
Creatinine, Ser: 1.04 mg/dL (ref 0.61–1.24)
GFR, Estimated: >60 mL/min (ref >60)
Glucose, Bld: 95 mg/dL (ref 70–99)
Potassium: 4.3 mmol/L (ref 3.5–5.1)
Sodium: 140 mmol/L (ref 135–145)
Total Bilirubin: 0.6 mg/dL (ref 0.3–1.2)
Total Protein: 7.4 g/dL (ref 6.5–8.1)

## 2021-02-01 LAB — LIPASE, BLOOD: Lipase: 37 U/L (ref 11–51)

## 2021-02-01 MED ORDER — ONDANSETRON 4 MG PO TBDP: 4.0000 mg | ORAL_TABLET | Freq: Three times a day (TID) | ORAL | 0 refills | Status: AC | PRN

## 2021-02-01 MED ORDER — HALOPERIDOL LACTATE 5 MG/ML IJ SOLN
2.5000 mg | Freq: Once | INTRAMUSCULAR | Status: AC
  Administered 2021-02-01: 2.5 mg via INTRAVENOUS
  Filled 2021-02-01: qty 1

## 2021-02-01 MED ORDER — ONDANSETRON HCL 4 MG/2ML IJ SOLN
4.0000 mg | Freq: Once | INTRAMUSCULAR | Status: AC
  Administered 2021-02-01: 4 mg via INTRAVENOUS
  Filled 2021-02-01: qty 2

## 2021-02-01 MED ORDER — HYDROMORPHONE HCL 1 MG/ML IJ SOLN
1.0000 mg | Freq: Once | INTRAMUSCULAR | Status: AC
  Administered 2021-02-01: 1 mg via INTRAVENOUS
  Filled 2021-02-01: qty 1

## 2021-02-01 MED ORDER — DICYCLOMINE HCL 20 MG PO TABS: 20.0000 mg | ORAL_TABLET | Freq: Three times a day (TID) | ORAL | 0 refills | Status: DC

## 2021-02-01 MED ORDER — LACTATED RINGERS IV BOLUS
1000.0000 mL | Freq: Once | INTRAVENOUS | Status: AC
  Administered 2021-02-01: 1000 mL via INTRAVENOUS

## 2021-02-01 MED ORDER — IOHEXOL 350 MG/ML SOLN
80.0000 mL | Freq: Once | INTRAVENOUS | Status: AC | PRN
  Administered 2021-02-01: 80 mL via INTRAVENOUS

## 2021-02-01 NOTE — ED Notes (Signed)
PT to CT.

## 2021-02-01 NOTE — ED Provider Notes (Signed)
[facility] Emergency Department Provider Note  ____________________________________________   Event Date/Time   First MD Initiated Contact with Patient 02/01/21 1618     (approximate)  I have reviewed the triage vital signs and the nursing notes.   HISTORY  Chief Complaint Abdominal Pain and Back Pain    HPI [name] is a 30 y.o. male  here with abd pain. Pt reports that over the past 4 days, she has progressively worsening diffuse but primarily lower abd pain. Pain is aching, throbbing, worse with movement, and palpation. Reports he has had some associated nausea, diarrhea. No known sick contacts. No fevers but has had some chills. He has had no urinary sx. No specific alleviating factors.       Past Medical History:  Diagnosis Date   Anxiety    Cardiac murmur    Chest pain    Cholelithiasis    Chronic pain    Dyspnea    Elevated blood pressure reading    Family history of adverse reaction to anesthesia    H. 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 Authorizing Provider  dicyclomine (BENTYL) 20 MG tablet Take 1 tablet (20 mg total) by mouth 4 (four) times daily -  before meals and at bedtime for 7 days. 02/01/21 02/08/21 Yes  Shaune Pollack, MD  ondansetron (ZOFRAN ODT) 4 MG disintegrating tablet Take 1 tablet (4 mg total) by mouth every 8 (eight) hours as needed for nausea or vomiting. 02/01/21  Yes Shaune Pollack, MD  colestipol (COLESTID) 1 g tablet Take 2 g by mouth 2 (two) times daily. 02/20/18 02/20/19  [provider]  hydrocortisone (PROCTOSOL HC) 2.5 % rectal cream Place 1 application rectally daily. 02/12/18   [provider]  Hyoscyamine Sulfate SL (LEVSIN/SL) 0.125 MG SUBL Place 0.25 mg under the tongue every 4 (four) hours as needed. 08/02/19   Triplett, Rulon Eisenmenger B, FNP  ketorolac (TORADOL) 10 MG tablet Take 1 tablet (10 mg total) by mouth every 6 (six) hours as needed. 10/12/20   Joni Reining, PA-C  metoCLOPramide (REGLAN) 5 MG tablet Take 5 mg by mouth 2 (two) times daily. 07/03/18   [provider]  Nitroglycerin 0.4 % OINT Place 1 application rectally 2 (two) times daily. 06/24/18   [provider]  omeprazole (PRILOSEC) 40 MG capsule Take 40 mg by mouth 2 (two) times daily. 02/20/18   [provider]  orphenadrine (NORFLEX) 100 MG tablet Take 1 tablet (100 mg total) by mouth 2 (two) times daily. 10/12/20   Joni Reining, PA-C  oxyCODONE (ROXICODONE) 5 MG immediate release tablet Take 1 tablet (5 mg total) by mouth every 8 (  eight) hours as needed for breakthrough pain or severe pain. 12/30/20 12/30/21  Delton Prairie, MD  pantoprazole (PROTONIX) 20 MG tablet Take 1 tablet (20 mg total) by mouth daily. 07/30/18 07/30/19  Jene Every, MD  PROAIR HFA 108 [phone] Base) MCG/ACT inhaler Inhale 2 puffs into the lungs every 4 (four) hours as needed for wheezing. 03/17/18   [provider]  sucralfate (CARAFATE) 1 g tablet Take 1 tablet (1 g total) by mouth 4 (four) times daily. 06/19/19   Phineas Semen, MD  topiramate (TOPAMAX) 25 MG tablet Take one tablet at night for one week, then take 2 tablets at night for one week, then take 3 tablets at night. 09/18/18   York Spaniel, MD    Allergies Adhesive [tape]  Family History  Problem Relation Age of Onset   Lung cancer Mother        and colon   Migraines Mother    Migraines Brother    Schizophrenia Brother     Social History Social History   Tobacco Use   Smoking status: Never   Smokeless tobacco: Never  Vaping Use   Vaping Use: Former  Substance Use Topics   Alcohol use: No   Drug use: No    Review of Systems  Review of Systems  Constitutional:  Positive for fatigue. Negative for chills and fever.  HENT:  Negative for sore throat.   Respiratory:  Negative for shortness of breath.   Cardiovascular:  Negative for chest pain.  Gastrointestinal:  Positive for abdominal pain, diarrhea, nausea and vomiting.  Genitourinary:  Negative for flank pain.  Musculoskeletal:  Negative for neck pain.  Skin:  Negative for rash and wound.  Allergic/Immunologic: Negative for immunocompromised state.  Neurological:  Positive for weakness. Negative for numbness.  Hematological:  Does not bruise/bleed easily.  All other systems reviewed and are negative.   ____________________________________________  PHYSICAL EXAM:      VITAL SIGNS: ED Triage Vitals  Enc Vitals Group     BP 02/01/21 1345 (!) 141/70     Pulse Rate 02/01/21 1345 73     Resp 02/01/21 1345 18     Temp 02/01/21 1345 98.7 F (37.1 C)     Temp Source 02/01/21 1345 Oral     SpO2 02/01/21 1345 98 %     Weight 02/01/21 1346 266 lb 12.1 oz (121 kg)     Height 02/01/21 1346 6' (1.829 m)     Head Circumference --      Peak Flow --      Pain Score 02/01/21 1346 9     Pain Loc --      Pain Edu? --      Excl. in [place]? --      Physical Exam Vitals and nursing note reviewed.  Constitutional:      General: He is not in acute distress.    Appearance: He is well-developed.  HENT:     Head: Normocephalic and atraumatic.  Eyes:     Conjunctiva/sclera: Conjunctivae normal.  Cardiovascular:     Rate and Rhythm: Normal rate and regular  rhythm.     Heart sounds: Normal heart sounds. No murmur heard.   No friction rub.  Pulmonary:     Effort: Pulmonary effort is normal. No respiratory distress.     Breath sounds: Normal breath sounds. No wheezing or rales.  Abdominal:     General: There is no distension.     Palpations: Abdomen is soft.  Tenderness: There is generalized abdominal tenderness and tenderness in the right lower quadrant, suprapubic area and left lower quadrant. There is guarding. There is no rebound.  Musculoskeletal:     Cervical back: Neck supple.  Skin:    General: Skin is warm.     Capillary Refill: Capillary refill takes less than 2 seconds.  Neurological:     Mental Status: He is alert and oriented to person, place, and time.     Motor: No abnormal muscle tone.      ____________________________________________   LABS (all labs ordered are listed, but only abnormal results are displayed)  Labs Reviewed  CBC - Abnormal; Notable for the following components:      Result Value   WBC 11.8 (*)    All other components within normal limits  LIPASE, BLOOD  COMPREHENSIVE METABOLIC PANEL    ____________________________________________ ________________________________________  RADIOLOGY All imaging, including plain films, CT scans, and ultrasounds, independently reviewed by me, and interpretations confirmed via formal radiology reads.  ED MD interpretation:   CT A/P: Negative  Official radiology report(s): CT ABDOMEN PELVIS W CONTRAST  Result Date: 02/01/2021 CLINICAL DATA:  Lower abdominal pain and lower back pain. EXAM: CT ABDOMEN AND PELVIS WITH CONTRAST TECHNIQUE: Multidetector CT imaging of the abdomen and pelvis was performed using the standard protocol following bolus administration of intravenous contrast. CONTRAST:  87mL OMNIPAQUE IOHEXOL 350 MG/ML SOLN COMPARISON:  December 30, 2020 FINDINGS: Lower chest: No acute abnormality. Hepatobiliary: No focal liver abnormality is seen. Status  post cholecystectomy. No biliary dilatation. Pancreas: Unremarkable. No pancreatic ductal dilatation or surrounding inflammatory changes. Spleen: Normal in size without focal abnormality. Adrenals/Urinary Tract: Adrenal glands are unremarkable. Kidneys are normal, without renal calculi, focal lesion, or hydronephrosis. Bladder is unremarkable. Stomach/Bowel: Stomach is within normal limits. Appendix appears normal. No evidence of bowel wall thickening, distention, or inflammatory changes. Vascular/Lymphatic: No significant vascular findings are present. No enlarged abdominal or pelvic lymph nodes. Reproductive: Prostate is unremarkable. Other: No abdominal wall hernia or abnormality. No abdominopelvic ascites. Musculoskeletal: No acute or significant osseous findings. IMPRESSION: 1. No CT evidence of acute intra-abdominal pathology. 2. Status post cholecystectomy. Electronically Signed   By: Aram Candela M.D.   On: 02/01/2021 17:22    ____________________________________________  PROCEDURES   Procedure(s) performed (including Critical Care):  Procedures  ____________________________________________  INITIAL IMPRESSION / MDM / ASSESSMENT AND PLAN / ED COURSE  As part of my medical decision making, I reviewed the following data within the electronic MEDICAL RECORD NUMBER Nursing notes reviewed and incorporated, Old chart reviewed, Notes from prior ED visits, and [place] Controlled Substance Database       *Darrell Hughes was evaluated in Emergency Department on 02/01/2021 for the symptoms described in the history of present illness. He was evaluated in the context of the global COVID-19 pandemic, which necessitated consideration that the patient might be at risk for infection with the SARS-CoV-2 virus that causes COVID-19. Institutional protocols and algorithms that pertain to the evaluation of patients at risk for COVID-19 are in a state of rapid change based on information released by regulatory  bodies including the CDC and federal and state organizations. These policies and algorithms were followed during the patient's care in the ED.  Some ED evaluations and interventions may be delayed as a result of limited staffing during the pandemic.*     Medical Decision Making:  30 yo M here with abd pain, nausea, vomiting. Suspect viral GI illness versus food borne illness. Pt is  well appearing and in NAD. Labs reassuring - no leukocytosis, lfts/renal function is wnl. CT a/p negative. Pt feels improved after analgesia, meds and is tolerating PO. Wil d/c with supportive care, encouraged hydration and outpt follow-up.  ____________________________________________  FINAL CLINICAL IMPRESSION(S) / ED DIAGNOSES  Final diagnoses:  Generalized abdominal pain     MEDICATIONS GIVEN DURING THIS VISIT:  Medications  HYDROmorphone (DILAUDID) injection 1 mg (1 mg Intravenous Given 02/01/21 1648)  ondansetron (ZOFRAN) injection 4 mg (4 mg Intravenous Given 02/01/21 1648)  lactated ringers bolus 1,000 mL (0 mLs Intravenous Stopped 02/01/21 1824)  iohexol (OMNIPAQUE) 350 MG/ML injection 80 mL (80 mLs Intravenous Contrast Given 02/01/21 1650)  haloperidol lactate (HALDOL) injection 2.5 mg (2.5 mg Intravenous Given 02/01/21 1810)     ED Discharge Orders          Ordered    ondansetron (ZOFRAN ODT) 4 MG disintegrating tablet  Every 8 hours PRN        02/01/21 1750    dicyclomine (BENTYL) 20 MG tablet  3 times daily before meals & bedtime        02/01/21 1750             Note:  This document was prepared using Dragon voice recognition software and may include unintentional dictation errors.   Shaune Pollack, MD 02/01/21 1949

## 2021-02-01 NOTE — ED Triage Notes (Signed)
Pt comes into the ED via POV c/o lower abd pain and low back pain.  Pt states that he has had the pain for 4 days and it is progressively getting worse.  PT admits to diarrhea and nausea, but no episodes of emesis.  Pt states the pain is worse with movement.  PT denies any problems with urination and denies any h/o kidney stones.

## 2021-02-01 NOTE — ED Notes (Signed)
Discharge papers reviewed by this RN, no questions at this time. Pt able to ambulate with family at discharge. No acute distress.

## 2021-06-14 ENCOUNTER — Other Ambulatory Visit: Payer: Self-pay

## 2021-06-14 ENCOUNTER — Emergency Department
Admission: EM | Admit: 2021-06-14 | Discharge: 2021-06-14 | Disposition: A | Discharge status: Home / Self Care | Payer: Medicaid Other | Attending: Emergency Medicine | Admitting: Emergency Medicine

## 2021-06-14 DIAGNOSIS — S61250A Open bite of right index finger without damage to nail, initial encounter: Secondary | ICD-10-CM | POA: Diagnosis not present

## 2021-06-14 DIAGNOSIS — S31159A Open bite of abdominal wall, unspecified quadrant without penetration into peritoneal cavity, initial encounter: Secondary | ICD-10-CM | POA: Diagnosis not present

## 2021-06-14 DIAGNOSIS — S51852A Open bite of left forearm, initial encounter: Secondary | ICD-10-CM | POA: Diagnosis not present

## 2021-06-14 DIAGNOSIS — Z203 Contact with and (suspected) exposure to rabies: Secondary | ICD-10-CM | POA: Insufficient documentation

## 2021-06-14 DIAGNOSIS — W5501XA Bitten by cat, initial encounter: Secondary | ICD-10-CM

## 2021-06-14 DIAGNOSIS — Z23 Encounter for immunization: Secondary | ICD-10-CM | POA: Diagnosis not present

## 2021-06-14 MED ORDER — RABIES VACCINE, PCEC IM SUSR
1.0000 mL | Freq: Once | INTRAMUSCULAR | Status: AC
  Administered 2021-06-14: 1 mL via INTRAMUSCULAR
  Filled 2021-06-14: qty 1

## 2021-06-14 MED ORDER — TETANUS-DIPHTH-ACELL PERTUSSIS 5-2.5-18.5 LF-MCG/0.5 IM SUSY
0.5000 mL | PREFILLED_SYRINGE | Freq: Once | INTRAMUSCULAR | Status: AC
  Administered 2021-06-14: 0.5 mL via INTRAMUSCULAR
  Filled 2021-06-14: qty 0.5

## 2021-06-14 MED ORDER — AMOXICILLIN-POT CLAVULANATE 875-125 MG PO TABS: 1.0000 | ORAL_TABLET | Freq: Two times a day (BID) | ORAL | 0 refills | Status: AC

## 2021-06-14 MED ORDER — AMOXICILLIN-POT CLAVULANATE 875-125 MG PO TABS
1.0000 | ORAL_TABLET | Freq: Once | ORAL | Status: AC
  Administered 2021-06-14: 1 via ORAL
  Filled 2021-06-14: qty 1

## 2021-06-14 NOTE — ED Provider Notes (Signed)
[facility] Provider Note    Event Date/Time   First MD Initiated Contact with Patient 06/14/21 1452     (approximate)   History   Animal Bite   HPI  Quiton Rutland is a 31 y.o. male wit past medical history of hypertension and hyperlipidemia who presents after multiple cat bites.  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  Also has a cat of his own.  Over the last 3 days has been bitten both by his cat and the stray cat several times.  This includes in the left forearm the abdomen and the right index finger.  He has already received a full rabies vaccination series about 10 years ago.  He is already called animal control.  There is no way for him to monitor the stray cat that bit him.    Past Medical History:  Diagnosis Date   Anxiety    Cardiac murmur    Chest pain    Cholelithiasis    Chronic pain    Dyspnea    Elevated blood pressure reading    Family history of adverse reaction to anesthesia    H. 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 --      Excl. in [place]? --     Most recent vital signs: Vitals:   06/14/21 1151  BP: (!) 157/83  Pulse: (!) 57  Resp: 18  Temp: 98 F (36.7 C)  SpO2: 97%     General: Awake, no  distress.  CV:  Good peripheral perfusion.  Resp:  Normal effort.  Abd:  No distention.  Neuro:             Awake, Alert, Oriented x 3  Other:  Scattered superficial abrasions and scabs in the left forearm, over the abdomen; right pointer finger with small wound in the distal phalanx with some surrounding erythema but no erythema or swelling tracking down the finger   ED Results / Procedures / Treatments  Labs (all labs ordered are listed, but only abnormal results are displayed) Labs Reviewed - No data to display   EKG     RADIOLOGY    PROCEDURES:     MEDICATIONS ORDERED IN ED: Medications  rabies vaccine (RABAVERT) injection 1 mL (has no administration in time range)  amoxicillin-clavulanate (AUGMENTIN) 875-125 MG per tablet 1 tablet (has  no administration in time range)     IMPRESSION / MDM / ASSESSMENT AND PLAN / ED COURSE  I reviewed the triage vital signs and the nursing notes.                              Differential diagnosis includes, but is not limited to, cat bite, cellulitis  Patient is a 31 year old male who presents after multiple cat bites and scratches.  Both by a stray cat that he is not able to monitor and his own cat.  He is not up-to-date on tetanus.  The bites occurred over the last 3 days.  On exam he has mostly superficial abrasions and scabs but the right pointer finger does have a wound with some surrounding erythema that looks like it may be developing a cellulitis but there is no signs of flexor tenosynovitis.  I am not concerned for Fracture.  Will prescribe a 5-day course of Augmentin.  Patient has already been vaccinated against rabies 1 so we will only need 2 doses of the rabies vaccine and no immunoglobulin.  FINAL CLINICAL IMPRESSION(S) / ED DIAGNOSES   Final diagnoses:  Cat bite, initial encounter     Rx / DC Orders   ED Discharge Orders          Ordered    amoxicillin-clavulanate (AUGMENTIN) 875-125 MG tablet  2 times daily         06/14/21 1502             Note:  This document was prepared using Dragon voice recognition software and may include unintentional dictation errors.   Rada Hay, MD 06/14/21 [phone]

## 2021-06-14 NOTE — Discharge Instructions (Signed)
You will need to get another rabies vaccine 3 days from now, for a total of 2 doses. Please take the antibiotic twice a day for the next 5 days.

## 2021-06-14 NOTE — ED Notes (Signed)
Pt states that a stray cat bit his right index finger on Monday. Pt has swelling to the entire index finger and is unable to bend at any of the joints. Pt's finger is approx double the size of the left index finger. Pt states that he received the complete rabies vaccines series approx 10 years ago from a dog bite

## 2021-06-14 NOTE — ED Triage Notes (Signed)
Pt come with c/o cat bite to right pointer finger, abdomen and leftarm. Pt states it was combination of his cat and a stray. This has been reported already to animal control

## 2021-06-16 ENCOUNTER — Other Ambulatory Visit: Payer: Self-pay

## 2021-06-16 ENCOUNTER — Encounter: Payer: Self-pay | Admitting: Emergency Medicine

## 2021-06-16 ENCOUNTER — Emergency Department
Admission: EM | Admit: 2021-06-16 | Discharge: 2021-06-16 | Disposition: A | Discharge status: Home / Self Care | Payer: Medicaid Other | Attending: Emergency Medicine | Admitting: Emergency Medicine

## 2021-06-16 DIAGNOSIS — W5501XA Bitten by cat, initial encounter: Secondary | ICD-10-CM | POA: Insufficient documentation

## 2021-06-16 DIAGNOSIS — Z23 Encounter for immunization: Secondary | ICD-10-CM | POA: Diagnosis present

## 2021-06-16 DIAGNOSIS — Z5321 Procedure and treatment not carried out due to patient leaving prior to being seen by health care provider: Secondary | ICD-10-CM | POA: Insufficient documentation

## 2021-06-16 NOTE — ED Triage Notes (Signed)
Pt here for his rabies vaccines. Pt in NAD in triage.

## 2021-06-16 NOTE — ED Notes (Signed)
Registered by mistake  pt was to come back on 2/4

## 2021-06-17 ENCOUNTER — Emergency Department
Admission: EM | Admit: 2021-06-17 | Discharge: 2021-06-17 | Disposition: A | Discharge status: Home / Self Care | Payer: Medicaid Other | Attending: Emergency Medicine | Admitting: Emergency Medicine

## 2021-06-17 ENCOUNTER — Other Ambulatory Visit: Payer: Self-pay

## 2021-06-17 ENCOUNTER — Encounter: Payer: Self-pay | Admitting: Emergency Medicine

## 2021-06-17 DIAGNOSIS — Z23 Encounter for immunization: Secondary | ICD-10-CM | POA: Insufficient documentation

## 2021-06-17 DIAGNOSIS — Z203 Contact with and (suspected) exposure to rabies: Secondary | ICD-10-CM | POA: Diagnosis present

## 2021-06-17 MED ORDER — RABIES VACCINE, PCEC IM SUSR
1.0000 mL | Freq: Once | INTRAMUSCULAR | Status: AC
  Administered 2021-06-17: 1 mL via INTRAMUSCULAR
  Filled 2021-06-17: qty 1

## 2021-06-17 NOTE — ED Provider Notes (Signed)
[facility] Provider Note    Event Date/Time   First MD Initiated Contact with Patient 06/17/21 1059     (approximate)   History   Rabies Injection   HPI Darrell Hughes is a 31 y.o. male with a stated past medical history of migraines who presents for his second booster in the rabies series after he was seen on 06/14/2021 for a stray cat bite and scratch to bilateral hands.  Patient states that these wounds have been healing well and he has been doing Epsom salt baths at least 40 minutes a day.  Patient has also been taking Augmentin to good effect.  Patient only complains of some slight itching around these wounds.  Patient denies any lethargy, headaches, dehydration, chest pain, abdominal pain, or nausea/vomiting/diarrhea     Physical Exam   Triage Vital Signs: ED Triage Vitals  Enc Vitals Group     BP 06/17/21 1044 (!) 142/77     Pulse Rate 06/17/21 1044 66     Resp 06/17/21 1044 16     Temp 06/17/21 1044 98.1 F (36.7 C)     Temp Source 06/17/21 1044 Oral     SpO2 06/17/21 1044 96 %     Weight 06/17/21 1042 268 lb 15.4 oz (122 kg)     Height 06/17/21 1042 6' (1.829 m)     Head Circumference --      Peak Flow --      Pain Score 06/17/21 1042 0     Pain Loc --      Pain Edu? --      Excl. in [place]? --     Most recent vital signs: Vitals:   06/17/21 1044  BP: (!) 142/77  Pulse: 66  Resp: 16  Temp: 98.1 F (36.7 C)  SpO2: 96%    General: Awake, no distress.  CV:  Good peripheral perfusion.  Resp:  Normal effort.  Abd:  No distention.  Other:  Superficial excoriations to bilateral hands   ED Results / Procedures / Treatments   Labs (all labs ordered are listed, but only abnormal results are displayed) Labs Reviewed - No data to display  PROCEDURES:  Critical Care performed: No  Procedures   MEDICATIONS ORDERED IN ED: Medications  rabies vaccine (RABAVERT) injection 1 mL (has no administration in time range)      IMPRESSION / MDM / ASSESSMENT AND PLAN / ED COURSE  I reviewed the triage vital signs and the nursing notes.                              Differential diagnosis includes, but is not limited to, rabies infection, skin soft tissue infection, flexor tenosynovitis, osteomyelitis Patient is a 31 year old male with the above-stated past medical history and HPI presents for the second dose in his rabies vaccination series.  This will be day 3 and patient will require return for day 7 and 14.  Patient provided resources to follow-up at urgent care for the subsequent doses.  Patient denies any concerning new symptoms including headache, lethargy, dehydration, or nausea/vomiting/diarrhea.  Patient's wounds are healing well without any surrounding erythema, induration, or purulent drainage.  Patient was encouraged to use Neosporin/bacitracin ointment for the itching that is likely coming from dry skin around these wounds as they are healing.  The patient has been reexamined and is ready to be discharged.  All diagnostic results have been reviewed and discussed  with the patient/family.  Care plan has been outlined and the patient/family understands all current diagnoses, results, and treatment plans.  There are no new complaints, changes, or physical findings at this time.  All questions have been addressed and answered.  Patient was instructed to, and agrees to follow-up with their primary care physician as well as return to the emergency department if any new or worsening symptoms develop.      FINAL CLINICAL IMPRESSION(S) / ED DIAGNOSES   Final diagnoses:  Encounter for repeat administration of rabies vaccination     Rx / DC Orders   ED Discharge Orders     None        Note:  This document was prepared using Dragon voice recognition software and may include unintentional dictation errors.   Merwyn Katos, MD 06/17/21 1120

## 2021-06-17 NOTE — ED Triage Notes (Signed)
Pt reports was exposed to a stray cat hat bit and scratched him and was advised to come get the rabies booster. Pt states has been through the series before so was he would only need a booster

## 2021-10-10 ENCOUNTER — Other Ambulatory Visit: Payer: Self-pay

## 2021-10-10 ENCOUNTER — Emergency Department
Admission: EM | Admit: 2021-10-10 | Discharge: 2021-10-10 | Disposition: A | Discharge status: Home / Self Care | Payer: Medicaid Other | Attending: Emergency Medicine | Admitting: Emergency Medicine

## 2021-10-10 ENCOUNTER — Emergency Department: Payer: Medicaid Other

## 2021-10-10 DIAGNOSIS — R0789 Other chest pain: Secondary | ICD-10-CM | POA: Diagnosis present

## 2021-10-10 DIAGNOSIS — M5412 Radiculopathy, cervical region: Secondary | ICD-10-CM | POA: Insufficient documentation

## 2021-10-10 DIAGNOSIS — M25512 Pain in left shoulder: Secondary | ICD-10-CM

## 2021-10-10 LAB — TROPONIN I (HIGH SENSITIVITY)
Troponin I (High Sensitivity): 4 ng/L (ref <18)
Troponin I (High Sensitivity): 4 ng/L (ref <18)

## 2021-10-10 LAB — CBC
HCT: 45 % (ref 39.0–52.0)
Hemoglobin: 14.2 g/dL (ref 13.0–17.0)
MCH: 27.7 pg (ref 26.0–34.0)
MCHC: 31.6 g/dL (ref 30.0–36.0)
MCV: 87.7 fL (ref 80.0–100.0)
Platelets: 253 10*3/uL (ref 150–400)
RBC: 5.13 MIL/uL (ref 4.22–5.81)
RDW: 14.5 % (ref 11.5–15.5)
WBC: 9.9 10*3/uL (ref 4.0–10.5)
nRBC: 0 % (ref 0.0–0.2)

## 2021-10-10 LAB — BASIC METABOLIC PANEL
Anion gap: 11 (ref 5–15)
BUN: 12 mg/dL (ref 6–20)
CO2: 26 mmol/L (ref 22–32)
Calcium: 9.6 mg/dL (ref 8.9–10.3)
Chloride: 104 mmol/L (ref 98–111)
Creatinine, Ser: 0.99 mg/dL (ref 0.61–1.24)
GFR, Estimated: >60 mL/min (ref >60)
Glucose, Bld: 114 mg/dL — ABNORMAL HIGH (ref 70–99)
Potassium: 4 mmol/L (ref 3.5–5.1)
Sodium: 141 mmol/L (ref 135–145)

## 2021-10-10 MED ORDER — CYCLOBENZAPRINE HCL 10 MG PO TABS: 10.0000 mg | ORAL_TABLET | Freq: Three times a day (TID) | ORAL | 0 refills | Status: DC | PRN

## 2021-10-10 NOTE — ED Notes (Signed)
Pt reports decreased sensation in his arm. Per pt it has been progressing over the last few weeks.

## 2021-10-10 NOTE — ED Provider Notes (Signed)
[facility] Provider Note   Event Date/Time   First MD Initiated Contact with Patient 10/10/21 1831     (approximate) History  Chest Pain  HPI Darrell Hughes is a 31 y.o. male  Location: Neck Duration: 1 month prior to arrival Timing: Intermittent with worse pain in the morning Severity: 7/10 Quality: Aching pain Context: Patient states that he does heavy lifting all day for his job and has had significant back injuries throughout this time including neck pain for the last month Modifying factors: Denies Associated Symptoms: Intermittent numbness and weakness in bilateral upper extremities as well as left-sided chest shoulder and arm pain ROS: Patient currently denies any vision changes, tinnitus, difficulty speaking, facial droop, sore throat, shortness of breath, abdominal pain, nausea/vomiting/diarrhea, dysuria, or weakness/numbness/paresthesias in any extremity   Physical Exam  Triage Vital Signs: ED Triage Vitals  Enc Vitals Group     BP 10/10/21 1454 139/81     Pulse Rate 10/10/21 1454 68     Resp 10/10/21 1454 17     Temp 10/10/21 1454 98.4 F (36.9 C)     Temp Source 10/10/21 1845 Oral     SpO2 10/10/21 1454 96 %     Weight 10/10/21 1452 274 lb (124.3 kg)     Height 10/10/21 1452 6' (1.829 m)     Head Circumference --      Peak Flow --      Pain Score 10/10/21 1452 7     Pain Loc --      Pain Edu? --      Excl. in [place]? --    Most recent vital signs: Vitals:   10/10/21 1454 10/10/21 1845  BP: 139/81 (!) 139/109  Pulse: 68 (!) 50  Resp: 17 18  Temp: 98.4 F (36.9 C) 98.5 F (36.9 C)  SpO2: 96% 98%   General: Awake, oriented x4. CV:  Good peripheral perfusion.  Resp:  Normal effort.  Abd:  No distention.  Other:  Middle-aged overweight Caucasian male laying in bed in no acute distress.  Spasm appreciated to bilateral cervical paraspinal musculature.  Strength and sensation intact bilaterally.  NIHSS is 0 ED Results /  Procedures / Treatments  Labs (all labs ordered are listed, but only abnormal results are displayed) Labs Reviewed  BASIC METABOLIC PANEL - Abnormal; Notable for the following components:      Result Value   Glucose, Bld 114 (*)    All other components within normal limits  CBC  TROPONIN I (HIGH SENSITIVITY)  TROPONIN I (HIGH SENSITIVITY)   EKG ED ECG REPORT I, Naaman Plummer, the attending physician, personally viewed and interpreted this ECG. Date: 10/10/2021 EKG Time: 1452 Rate: 66 Rhythm: normal sinus rhythm QRS Axis: normal Intervals: normal ST/T Wave abnormalities: normal Narrative Interpretation: no evidence of acute ischemia RADIOLOGY ED MD interpretation: 2 view chest x-ray interpreted by me shows no evidence of acute abnormalities including no pneumonia, pneumothorax, or widened mediastinum -Agree with radiology assessment Official radiology report(s): DG Chest 2 View  Result Date: 10/10/2021 CLINICAL DATA:  Chest and neck pain for 1 week. EXAM: CHEST - 2 VIEW COMPARISON:  08/12/2012 FINDINGS: The cardiac silhouette, mediastinal and hilar contours are within normal limits. The lungs are clear. No pleural effusions or pulmonary lesions. The bony structures are intact. IMPRESSION: No acute cardiopulmonary findings. Electronically Signed   By: Marijo Sanes M.D.   On: 10/10/2021 15:19   PROCEDURES: Critical Care performed: No .1-3 Lead EKG Interpretation  Performed by: Naaman Plummer, MD Authorized by: Naaman Plummer, MD     Interpretation: normal     ECG rate:  61   ECG rate assessment: normal     Rhythm: sinus rhythm     Ectopy: none     Conduction: normal   MEDICATIONS ORDERED IN ED: Medications - No data to display IMPRESSION / MDM / [place] / ED COURSE  I reviewed the triage vital signs and the nursing notes.                             The patient is on the cardiac monitor to evaluate for evidence of arrhythmia and/or significant heart rate  changes. Patient's presentation is most consistent with exacerbation of chronic illness. + neck pain + sensation of weakness and numbness.  ED Workup: Defer C-Spine imaging given negative by NEXUS criteria  Given History, Exam the patient appears to have a cervical radiculopathy.   Patient appears to be low risk for complications or other emergent conditions such as  anginal equivalent, frank cervical instability, arterial dissection, osteomyelitis, epidural abscess, central cord syndrome, c-spine fracture, other spinal emergencies  Rx: NSAIDs, outpatient physical therapy evaluation and recommendation for home exercises in the interim Disposition: Discharge. The patient has been given strict return precautions and understands the need to follow up within 48 hours with their primary care provider   FINAL CLINICAL IMPRESSION(S) / ED DIAGNOSES   Final diagnoses:  Cervical radiculopathy  Intermittent left-sided chest pain  Acute pain of left shoulder   Rx / DC Orders   ED Discharge Orders          Ordered    cyclobenzaprine (FLEXERIL) 10 MG tablet  3 times daily PRN        10/10/21 1843           Note:  This document was prepared using Dragon voice recognition software and may include unintentional dictation errors.   Naaman Plummer, MD 10/10/21 Einar Crow

## 2021-10-10 NOTE — ED Triage Notes (Signed)
Pr here with CP that started last week. Pt states pain is left sided and radiates to his arm, neck, and back. Pt denies and N/V/D.

## 2021-12-10 ENCOUNTER — Emergency Department (HOSPITAL_COMMUNITY): Payer: Medicaid Other | Admitting: Certified Registered Nurse Anesthetist

## 2021-12-10 ENCOUNTER — Emergency Department (HOSPITAL_COMMUNITY): Payer: Medicaid Other

## 2021-12-10 ENCOUNTER — Encounter (HOSPITAL_COMMUNITY)
Admission: EM | Disposition: A | Discharge status: Other Acute Inpatient Hospital | Payer: Self-pay | Source: Home / Self Care | Attending: Internal Medicine

## 2021-12-10 ENCOUNTER — Other Ambulatory Visit: Payer: Self-pay

## 2021-12-10 ENCOUNTER — Encounter (HOSPITAL_COMMUNITY): Payer: Self-pay

## 2021-12-10 ENCOUNTER — Inpatient Hospital Stay (HOSPITAL_COMMUNITY)
Admission: EM | Admit: 2021-12-10 | Discharge: 2021-12-14 | DRG: 908 | Disposition: A | Discharge status: Other Acute Inpatient Hospital | Payer: Medicaid Other | Attending: Internal Medicine | Admitting: Internal Medicine

## 2021-12-10 DIAGNOSIS — S55192A Other specified injury of radial artery at forearm level, left arm, initial encounter: Secondary | ICD-10-CM | POA: Diagnosis not present

## 2021-12-10 DIAGNOSIS — Z9049 Acquired absence of other specified parts of digestive tract: Secondary | ICD-10-CM

## 2021-12-10 DIAGNOSIS — D72829 Elevated white blood cell count, unspecified: Secondary | ICD-10-CM | POA: Diagnosis present

## 2021-12-10 DIAGNOSIS — F4310 Post-traumatic stress disorder, unspecified: Secondary | ICD-10-CM | POA: Diagnosis present

## 2021-12-10 DIAGNOSIS — R45851 Suicidal ideations: Secondary | ICD-10-CM

## 2021-12-10 DIAGNOSIS — Z818 Family history of other mental and behavioral disorders: Secondary | ICD-10-CM | POA: Diagnosis not present

## 2021-12-10 DIAGNOSIS — E785 Hyperlipidemia, unspecified: Secondary | ICD-10-CM | POA: Diagnosis present

## 2021-12-10 DIAGNOSIS — X781XXA Intentional self-harm by knife, initial encounter: Secondary | ICD-10-CM | POA: Diagnosis present

## 2021-12-10 DIAGNOSIS — F22 Delusional disorders: Secondary | ICD-10-CM | POA: Diagnosis present

## 2021-12-10 DIAGNOSIS — Z9889 Other specified postprocedural states: Secondary | ICD-10-CM | POA: Diagnosis not present

## 2021-12-10 DIAGNOSIS — Z23 Encounter for immunization: Secondary | ICD-10-CM

## 2021-12-10 DIAGNOSIS — D62 Acute posthemorrhagic anemia: Secondary | ICD-10-CM | POA: Diagnosis present

## 2021-12-10 DIAGNOSIS — Z6837 Body mass index (BMI) 37.0-37.9, adult: Secondary | ICD-10-CM | POA: Diagnosis not present

## 2021-12-10 DIAGNOSIS — X789XXA Intentional self-harm by unspecified sharp object, initial encounter: Secondary | ICD-10-CM | POA: Diagnosis not present

## 2021-12-10 DIAGNOSIS — R4584 Anhedonia: Secondary | ICD-10-CM | POA: Diagnosis present

## 2021-12-10 DIAGNOSIS — Z9109 Other allergy status, other than to drugs and biological substances: Secondary | ICD-10-CM

## 2021-12-10 DIAGNOSIS — E872 Acidosis, unspecified: Secondary | ICD-10-CM | POA: Diagnosis present

## 2021-12-10 DIAGNOSIS — S55112A Laceration of radial artery at forearm level, left arm, initial encounter: Secondary | ICD-10-CM

## 2021-12-10 DIAGNOSIS — E876 Hypokalemia: Secondary | ICD-10-CM | POA: Diagnosis present

## 2021-12-10 DIAGNOSIS — R7989 Other specified abnormal findings of blood chemistry: Secondary | ICD-10-CM | POA: Diagnosis present

## 2021-12-10 DIAGNOSIS — Z79899 Other long term (current) drug therapy: Secondary | ICD-10-CM

## 2021-12-10 DIAGNOSIS — N179 Acute kidney failure, unspecified: Secondary | ICD-10-CM

## 2021-12-10 DIAGNOSIS — F419 Anxiety disorder, unspecified: Secondary | ICD-10-CM | POA: Diagnosis present

## 2021-12-10 DIAGNOSIS — F319 Bipolar disorder, unspecified: Secondary | ICD-10-CM | POA: Diagnosis present

## 2021-12-10 DIAGNOSIS — G8929 Other chronic pain: Secondary | ICD-10-CM | POA: Diagnosis present

## 2021-12-10 DIAGNOSIS — E559 Vitamin D deficiency, unspecified: Secondary | ICD-10-CM | POA: Diagnosis present

## 2021-12-10 DIAGNOSIS — S51812A Laceration without foreign body of left forearm, initial encounter: Secondary | ICD-10-CM | POA: Diagnosis present

## 2021-12-10 DIAGNOSIS — K219 Gastro-esophageal reflux disease without esophagitis: Secondary | ICD-10-CM | POA: Diagnosis present

## 2021-12-10 DIAGNOSIS — R739 Hyperglycemia, unspecified: Secondary | ICD-10-CM | POA: Diagnosis present

## 2021-12-10 DIAGNOSIS — E669 Obesity, unspecified: Secondary | ICD-10-CM | POA: Diagnosis present

## 2021-12-10 DIAGNOSIS — Z20822 Contact with and (suspected) exposure to covid-19: Secondary | ICD-10-CM | POA: Diagnosis present

## 2021-12-10 DIAGNOSIS — S59912A Unspecified injury of left forearm, initial encounter: Secondary | ICD-10-CM | POA: Diagnosis not present

## 2021-12-10 HISTORY — PX: THROMBECTOMY W/ EMBOLECTOMY: SHX2507

## 2021-12-10 LAB — COMPREHENSIVE METABOLIC PANEL
ALT: 17 U/L (ref 0–44)
AST: 22 U/L (ref 15–41)
Albumin: 4.3 g/dL (ref 3.5–5.0)
Alkaline Phosphatase: 91 U/L (ref 38–126)
Anion gap: 16 — ABNORMAL HIGH (ref 5–15)
BUN: 11 mg/dL (ref 6–20)
CO2: 14 mmol/L — ABNORMAL LOW (ref 22–32)
Calcium: 9.8 mg/dL (ref 8.9–10.3)
Chloride: 108 mmol/L (ref 98–111)
Creatinine, Ser: 1.42 mg/dL — ABNORMAL HIGH (ref 0.61–1.24)
GFR, Estimated: >60 mL/min (ref >60)
Glucose, Bld: 196 mg/dL — ABNORMAL HIGH (ref 70–99)
Potassium: 3.4 mmol/L — ABNORMAL LOW (ref 3.5–5.1)
Sodium: 138 mmol/L (ref 135–145)
Total Bilirubin: 0.8 mg/dL (ref 0.3–1.2)
Total Protein: 7.5 g/dL (ref 6.5–8.1)

## 2021-12-10 LAB — I-STAT CHEM 8, ED
BUN: 10 mg/dL (ref 6–20)
Calcium, Ion: 1.16 mmol/L (ref 1.15–1.40)
Chloride: 108 mmol/L (ref 98–111)
Creatinine, Ser: 1.3 mg/dL — ABNORMAL HIGH (ref 0.61–1.24)
Glucose, Bld: 194 mg/dL — ABNORMAL HIGH (ref 70–99)
HCT: 47 % (ref 39.0–52.0)
Hemoglobin: 16 g/dL (ref 13.0–17.0)
Potassium: 3.5 mmol/L (ref 3.5–5.1)
Sodium: 141 mmol/L (ref 135–145)
TCO2: 15 mmol/L — ABNORMAL LOW (ref 22–32)

## 2021-12-10 LAB — CBC WITH DIFFERENTIAL/PLATELET
Abs Immature Granulocytes: 0.07 10*3/uL (ref 0.00–0.07)
Basophils Absolute: 0.1 10*3/uL (ref 0.0–0.1)
Basophils Relative: 0 %
Eosinophils Absolute: 0.2 10*3/uL (ref 0.0–0.5)
Eosinophils Relative: 1 %
HCT: 44.7 % (ref 39.0–52.0)
Hemoglobin: 15 g/dL (ref 13.0–17.0)
Immature Granulocytes: 0 %
Lymphocytes Relative: 21 %
Lymphs Abs: 3.7 10*3/uL (ref 0.7–4.0)
MCH: 28.7 pg (ref 26.0–34.0)
MCHC: 33.6 g/dL (ref 30.0–36.0)
MCV: 85.6 fL (ref 80.0–100.0)
Monocytes Absolute: 1.2 10*3/uL — ABNORMAL HIGH (ref 0.1–1.0)
Monocytes Relative: 7 %
Neutro Abs: 12.5 10*3/uL — ABNORMAL HIGH (ref 1.7–7.7)
Neutrophils Relative %: 71 %
Platelets: 277 10*3/uL (ref 150–400)
RBC: 5.22 MIL/uL (ref 4.22–5.81)
RDW: 14.1 % (ref 11.5–15.5)
WBC: 17.7 10*3/uL — ABNORMAL HIGH (ref 4.0–10.5)
nRBC: 0 % (ref 0.0–0.2)

## 2021-12-10 LAB — LACTIC ACID, PLASMA: Lactic Acid, Venous: 5.4 mmol/L (ref 0.5–1.9)

## 2021-12-10 LAB — POCT I-STAT, CHEM 8
BUN: 15 mg/dL (ref 6–20)
Calcium, Ion: 1.22 mmol/L (ref 1.15–1.40)
Chloride: 107 mmol/L (ref 98–111)
Creatinine, Ser: 1.1 mg/dL (ref 0.61–1.24)
Glucose, Bld: 148 mg/dL — ABNORMAL HIGH (ref 70–99)
HCT: 42 % (ref 39.0–52.0)
Hemoglobin: 14.3 g/dL (ref 13.0–17.0)
Potassium: 4.5 mmol/L (ref 3.5–5.1)
Sodium: 141 mmol/L (ref 135–145)
TCO2: 25 mmol/L (ref 22–32)

## 2021-12-10 LAB — URINALYSIS, ROUTINE W REFLEX MICROSCOPIC
Bilirubin Urine: NEGATIVE
Glucose, UA: NEGATIVE mg/dL
Hgb urine dipstick: NEGATIVE
Ketones, ur: 20 mg/dL — AB
Leukocytes,Ua: NEGATIVE
Nitrite: NEGATIVE
Protein, ur: 100 mg/dL — AB
Specific Gravity, Urine: 1.029 (ref 1.005–1.030)
pH: 5 (ref 5.0–8.0)

## 2021-12-10 LAB — TYPE AND SCREEN
ABO/RH(D): O POS
Antibody Screen: NEGATIVE

## 2021-12-10 LAB — PROTIME-INR
INR: 1.1 (ref 0.8–1.2)
Prothrombin Time: 13.6 seconds (ref 11.4–15.2)

## 2021-12-10 LAB — ETHANOL: Alcohol, Ethyl (B): <10 mg/dL (ref <10)

## 2021-12-10 SURGERY — THROMBECTOMY ARTERIOVENOUS FISTULA
Anesthesia: General | Site: Arm Lower | Laterality: Left

## 2021-12-10 MED ORDER — LACTATED RINGERS IV BOLUS: 1000.0000 mL | Freq: Once | INTRAVENOUS | Status: DC

## 2021-12-10 MED ORDER — FENTANYL CITRATE (PF) 250 MCG/5ML IJ SOLN
INTRAMUSCULAR | Status: AC
  Filled 2021-12-10: qty 5

## 2021-12-10 MED ORDER — PROPOFOL 10 MG/ML IV BOLUS
INTRAVENOUS | Status: AC
  Filled 2021-12-10: qty 20

## 2021-12-10 MED ORDER — ONDANSETRON 4 MG PO TBDP: 4.0000 mg | ORAL_TABLET | Freq: Three times a day (TID) | ORAL | Status: DC | PRN

## 2021-12-10 MED ORDER — FENTANYL CITRATE PF 50 MCG/ML IJ SOSY
PREFILLED_SYRINGE | INTRAMUSCULAR | Status: AC
  Filled 2021-12-10: qty 1

## 2021-12-10 MED ORDER — OXYCODONE HCL 5 MG/5ML PO SOLN: 5.0000 mg | Freq: Once | ORAL | Status: DC | PRN

## 2021-12-10 MED ORDER — FENTANYL CITRATE PF 50 MCG/ML IJ SOSY
100.0000 ug | PREFILLED_SYRINGE | Freq: Once | INTRAMUSCULAR | Status: AC
  Administered 2021-12-10: 100 ug via INTRAVENOUS
  Filled 2021-12-10: qty 2

## 2021-12-10 MED ORDER — HYDROXYZINE HCL 25 MG PO TABS
25.0000 mg | ORAL_TABLET | Freq: Three times a day (TID) | ORAL | Status: DC | PRN
  Administered 2021-12-11: 25 mg via ORAL
  Filled 2021-12-10: qty 1

## 2021-12-10 MED ORDER — HEPARIN 6000 UNIT IRRIGATION SOLUTION
Status: AC
  Filled 2021-12-10: qty 500

## 2021-12-10 MED ORDER — PROPOFOL 10 MG/ML IV BOLUS
INTRAVENOUS | Status: DC | PRN
  Administered 2021-12-10: 200 mg via INTRAVENOUS

## 2021-12-10 MED ORDER — MIDAZOLAM HCL 2 MG/2ML IJ SOLN
INTRAMUSCULAR | Status: AC
  Filled 2021-12-10: qty 2

## 2021-12-10 MED ORDER — CEFAZOLIN SODIUM-DEXTROSE 2-4 GM/100ML-% IV SOLN
2.0000 g | Freq: Three times a day (TID) | INTRAVENOUS | Status: AC
  Administered 2021-12-11 (×2): 2 g via INTRAVENOUS
  Filled 2021-12-10 (×2): qty 100

## 2021-12-10 MED ORDER — AMISULPRIDE (ANTIEMETIC) 5 MG/2ML IV SOLN
INTRAVENOUS | Status: DC | PRN
  Administered 2021-12-10: 10 mg via INTRAVENOUS

## 2021-12-10 MED ORDER — OXYCODONE HCL 5 MG PO TABS: 5.0000 mg | ORAL_TABLET | Freq: Once | ORAL | Status: DC | PRN

## 2021-12-10 MED ORDER — POTASSIUM CHLORIDE CRYS ER 20 MEQ PO TBCR: 20.0000 meq | EXTENDED_RELEASE_TABLET | Freq: Every day | ORAL | Status: DC | PRN

## 2021-12-10 MED ORDER — CYCLOBENZAPRINE HCL 10 MG PO TABS
10.0000 mg | ORAL_TABLET | Freq: Three times a day (TID) | ORAL | Status: DC | PRN
  Administered 2021-12-11 – 2021-12-14 (×5): 10 mg via ORAL
  Filled 2021-12-10 (×5): qty 1

## 2021-12-10 MED ORDER — SUGAMMADEX SODIUM 200 MG/2ML IV SOLN
INTRAVENOUS | Status: DC | PRN
  Administered 2021-12-10 (×3): 100 mg via INTRAVENOUS

## 2021-12-10 MED ORDER — LACTATED RINGERS IV SOLN: INTRAVENOUS | Status: DC | PRN

## 2021-12-10 MED ORDER — HEPARIN 6000 UNIT IRRIGATION SOLUTION
Status: DC | PRN
  Administered 2021-12-10: 1

## 2021-12-10 MED ORDER — MIDAZOLAM HCL 2 MG/2ML IJ SOLN
INTRAMUSCULAR | Status: DC | PRN
  Administered 2021-12-10: 2 mg via INTRAVENOUS

## 2021-12-10 MED ORDER — SUCCINYLCHOLINE CHLORIDE 200 MG/10ML IV SOSY
PREFILLED_SYRINGE | INTRAVENOUS | Status: DC | PRN
  Administered 2021-12-10: 200 mg via INTRAVENOUS

## 2021-12-10 MED ORDER — MAGNESIUM SULFATE 2 GM/50ML IV SOLN: 2.0000 g | Freq: Every day | INTRAVENOUS | Status: DC | PRN

## 2021-12-10 MED ORDER — SODIUM CHLORIDE 0.9 % IV SOLN: INTRAVENOUS | Status: AC

## 2021-12-10 MED ORDER — FENTANYL CITRATE (PF) 250 MCG/5ML IJ SOLN
INTRAMUSCULAR | Status: DC | PRN
  Administered 2021-12-10: 150 ug via INTRAVENOUS

## 2021-12-10 MED ORDER — ACETAMINOPHEN 325 MG PO TABS: 325.0000 mg | ORAL_TABLET | ORAL | Status: DC | PRN

## 2021-12-10 MED ORDER — PROMETHAZINE HCL 25 MG/ML IJ SOLN: 6.2500 mg | INTRAMUSCULAR | Status: DC | PRN

## 2021-12-10 MED ORDER — MIDAZOLAM HCL 2 MG/2ML IJ SOLN: 0.5000 mg | Freq: Once | INTRAMUSCULAR | Status: DC | PRN

## 2021-12-10 MED ORDER — HYDROMORPHONE HCL 1 MG/ML IJ SOLN: 0.2500 mg | INTRAMUSCULAR | Status: DC | PRN

## 2021-12-10 MED ORDER — OXYCODONE-ACETAMINOPHEN 5-325 MG PO TABS
1.0000 | ORAL_TABLET | ORAL | Status: DC | PRN
  Administered 2021-12-10 – 2021-12-12 (×6): 2 via ORAL
  Administered 2021-12-12: 1 via ORAL
  Administered 2021-12-13 – 2021-12-14 (×8): 2 via ORAL
  Filled 2021-12-10 (×15): qty 2

## 2021-12-10 MED ORDER — MEPERIDINE HCL 25 MG/ML IJ SOLN: 6.2500 mg | INTRAMUSCULAR | Status: DC | PRN

## 2021-12-10 MED ORDER — DEXMEDETOMIDINE (PRECEDEX) IN NS 20 MCG/5ML (4 MCG/ML) IV SYRINGE
PREFILLED_SYRINGE | INTRAVENOUS | Status: DC | PRN
  Administered 2021-12-10 (×2): 8 ug via INTRAVENOUS
  Administered 2021-12-10: 4 ug via INTRAVENOUS

## 2021-12-10 MED ORDER — TETANUS-DIPHTH-ACELL PERTUSSIS 5-2.5-18.5 LF-MCG/0.5 IM SUSY
0.5000 mL | PREFILLED_SYRINGE | Freq: Once | INTRAMUSCULAR | Status: AC
  Administered 2021-12-10: 0.5 mL via INTRAMUSCULAR
  Filled 2021-12-10: qty 0.5

## 2021-12-10 MED ORDER — 0.9 % SODIUM CHLORIDE (POUR BTL) OPTIME
TOPICAL | Status: DC | PRN
  Administered 2021-12-10: 1000 mL

## 2021-12-10 MED ORDER — ONDANSETRON HCL 4 MG/2ML IJ SOLN: 4.0000 mg | Freq: Four times a day (QID) | INTRAMUSCULAR | Status: DC | PRN

## 2021-12-10 MED ORDER — SODIUM CHLORIDE 0.9 % IV SOLN: 500.0000 mL | Freq: Once | INTRAVENOUS | Status: DC | PRN

## 2021-12-10 MED ORDER — FENTANYL CITRATE PF 50 MCG/ML IJ SOSY: 50.0000 ug | PREFILLED_SYRINGE | Freq: Once | INTRAMUSCULAR | Status: AC

## 2021-12-10 MED ORDER — LIDOCAINE 2% (20 MG/ML) 5 ML SYRINGE
INTRAMUSCULAR | Status: DC | PRN
  Administered 2021-12-10: 80 mg via INTRAVENOUS

## 2021-12-10 MED ORDER — AMISULPRIDE (ANTIEMETIC) 5 MG/2ML IV SOLN
INTRAVENOUS | Status: AC
  Filled 2021-12-10: qty 4

## 2021-12-10 MED ORDER — ONDANSETRON HCL 4 MG/2ML IJ SOLN
INTRAMUSCULAR | Status: DC | PRN
  Administered 2021-12-10: 4 mg via INTRAVENOUS

## 2021-12-10 MED ORDER — METOPROLOL TARTRATE 5 MG/5ML IV SOLN: 2.0000 mg | INTRAVENOUS | Status: DC | PRN

## 2021-12-10 MED ORDER — ROCURONIUM BROMIDE 10 MG/ML (PF) SYRINGE
PREFILLED_SYRINGE | INTRAVENOUS | Status: DC | PRN
  Administered 2021-12-10: 40 mg via INTRAVENOUS

## 2021-12-10 MED ORDER — SERTRALINE HCL 100 MG PO TABS
100.0000 mg | ORAL_TABLET | Freq: Every day | ORAL | Status: DC
  Administered 2021-12-10 – 2021-12-14 (×5): 100 mg via ORAL
  Filled 2021-12-10 (×5): qty 1

## 2021-12-10 MED ORDER — ACETAMINOPHEN 650 MG RE SUPP: 325.0000 mg | RECTAL | Status: DC | PRN

## 2021-12-10 MED ORDER — LABETALOL HCL 5 MG/ML IV SOLN: 10.0000 mg | INTRAVENOUS | Status: DC | PRN

## 2021-12-10 MED ORDER — FENTANYL CITRATE PF 50 MCG/ML IJ SOSY
PREFILLED_SYRINGE | INTRAMUSCULAR | Status: AC
  Administered 2021-12-10: 50 ug via INTRAVENOUS
  Filled 2021-12-10: qty 1

## 2021-12-10 MED ORDER — HYDRALAZINE HCL 20 MG/ML IJ SOLN: 5.0000 mg | INTRAMUSCULAR | Status: DC | PRN

## 2021-12-10 MED ORDER — ASPIRIN 81 MG PO TBEC
81.0000 mg | DELAYED_RELEASE_TABLET | Freq: Every day | ORAL | Status: DC
  Administered 2021-12-11 – 2021-12-14 (×3): 81 mg via ORAL
  Filled 2021-12-10 (×3): qty 1

## 2021-12-10 MED ORDER — MORPHINE SULFATE (PF) 2 MG/ML IV SOLN
2.0000 mg | INTRAVENOUS | Status: DC | PRN
  Administered 2021-12-11 – 2021-12-13 (×6): 2 mg via INTRAVENOUS
  Filled 2021-12-10 (×6): qty 1

## 2021-12-10 MED ORDER — PRAZOSIN HCL 1 MG PO CAPS
1.0000 mg | ORAL_CAPSULE | Freq: Every day | ORAL | Status: DC | PRN
  Filled 2021-12-10: qty 1

## 2021-12-10 MED ORDER — CEFAZOLIN SODIUM-DEXTROSE 2-4 GM/100ML-% IV SOLN
2.0000 g | Freq: Once | INTRAVENOUS | Status: AC
  Administered 2021-12-10: 2 g via INTRAVENOUS
  Filled 2021-12-10: qty 100

## 2021-12-10 MED ORDER — DEXAMETHASONE SODIUM PHOSPHATE 10 MG/ML IJ SOLN
INTRAMUSCULAR | Status: DC | PRN
  Administered 2021-12-10: 5 mg via INTRAVENOUS

## 2021-12-10 MED ORDER — FENTANYL CITRATE PF 50 MCG/ML IJ SOSY
50.0000 ug | PREFILLED_SYRINGE | Freq: Once | INTRAMUSCULAR | Status: AC
  Administered 2021-12-10: 50 ug via INTRAVENOUS

## 2021-12-10 SURGICAL SUPPLY — 50 items
APL PRP STRL LF DISP 70% ISPRP (MISCELLANEOUS) ×1
APL SKNCLS STERI-STRIP NONHPOA (GAUZE/BANDAGES/DRESSINGS) ×1
ARMBAND PINK RESTRICT EXTREMIT (MISCELLANEOUS) ×1 IMPLANT
BAG COUNTER SPONGE SURGICOUNT (BAG) ×2 IMPLANT
BAG SPNG CNTER NS LX DISP (BAG) ×1
BENZOIN TINCTURE PRP APPL 2/3 (GAUZE/BANDAGES/DRESSINGS) ×2 IMPLANT
BNDG ELASTIC 4X5.8 VLCR STR LF (GAUZE/BANDAGES/DRESSINGS) ×1 IMPLANT
BNDG ELASTIC 6X5.8 VLCR STR LF (GAUZE/BANDAGES/DRESSINGS) ×1 IMPLANT
BNDG GAUZE DERMACEA FLUFF (GAUZE/BANDAGES/DRESSINGS) ×1
BNDG GAUZE DERMACEA FLUFF 4 (GAUZE/BANDAGES/DRESSINGS) IMPLANT
BNDG GZE DERMACEA 4 6PLY (GAUZE/BANDAGES/DRESSINGS) ×1
CANISTER SUCT 3000ML PPV (MISCELLANEOUS) ×2 IMPLANT
CANNULA VESSEL 3MM 2 BLNT TIP (CANNULA) ×2 IMPLANT
CHLORAPREP W/TINT 26 (MISCELLANEOUS) ×2 IMPLANT
CLIP LIGATING EXTRA MED SLVR (CLIP) ×1 IMPLANT
CLIP LIGATING EXTRA SM BLUE (MISCELLANEOUS) ×1 IMPLANT
COVER PROBE W GEL 5X96 (DRAPES) IMPLANT
CUFF TOURN SGL QUICK 24 (TOURNIQUET CUFF) ×2
CUFF TRNQT CYL 24X4X40X1 (TOURNIQUET CUFF) IMPLANT
DRSG PAD ABDOMINAL 8X10 ST (GAUZE/BANDAGES/DRESSINGS) ×2 IMPLANT
ELECT REM PT RETURN 9FT ADLT (ELECTROSURGICAL) ×2
ELECTRODE REM PT RTRN 9FT ADLT (ELECTROSURGICAL) ×1 IMPLANT
GAUZE PACKING IODOFORM 1X5 (PACKING) ×1 IMPLANT
GAUZE SPONGE 4X4 12PLY STRL (GAUZE/BANDAGES/DRESSINGS) ×1 IMPLANT
GAUZE XEROFORM 5X9 LF (GAUZE/BANDAGES/DRESSINGS) ×1 IMPLANT
GLOVE BIO SURGEON STRL SZ8 (GLOVE) ×2 IMPLANT
GOWN STRL REUS W/ TWL LRG LVL3 (GOWN DISPOSABLE) ×2 IMPLANT
GOWN STRL REUS W/ TWL XL LVL3 (GOWN DISPOSABLE) ×1 IMPLANT
GOWN STRL REUS W/TWL LRG LVL3 (GOWN DISPOSABLE) ×4
GOWN STRL REUS W/TWL XL LVL3 (GOWN DISPOSABLE) ×2
INSERT FOGARTY SM (MISCELLANEOUS) IMPLANT
KIT BASIN OR (CUSTOM PROCEDURE TRAY) ×2 IMPLANT
KIT TURNOVER KIT B (KITS) ×2 IMPLANT
NS IRRIG 1000ML POUR BTL (IV SOLUTION) ×2 IMPLANT
PACK CV ACCESS (CUSTOM PROCEDURE TRAY) ×2 IMPLANT
PAD ARMBOARD 7.5X6 YLW CONV (MISCELLANEOUS) ×4 IMPLANT
SLING ARM FOAM STRAP LRG (SOFTGOODS) ×1 IMPLANT
STAPLER VISISTAT 35W (STAPLE) ×2 IMPLANT
STRIP CLOSURE SKIN 1/2X4 (GAUZE/BANDAGES/DRESSINGS) ×2 IMPLANT
SUT ETHILON 2 0 PSLX (SUTURE) ×3 IMPLANT
SUT MNCRL AB 4-0 PS2 18 (SUTURE) ×2 IMPLANT
SUT PROLENE 5 0 C 1 24 (SUTURE) ×1 IMPLANT
SUT PROLENE 6 0 BV (SUTURE) ×3 IMPLANT
SUT SILK 2 0 PERMA HAND 18 BK (SUTURE) ×1 IMPLANT
SUT SILK 2 0 SH (SUTURE) ×1 IMPLANT
SUT VIC AB 3-0 SH 27 (SUTURE) ×2
SUT VIC AB 3-0 SH 27X BRD (SUTURE) ×1 IMPLANT
TOWEL GREEN STERILE (TOWEL DISPOSABLE) ×2 IMPLANT
UNDERPAD 30X36 HEAVY ABSORB (UNDERPADS AND DIAPERS) ×2 IMPLANT
WATER STERILE IRR 1000ML POUR (IV SOLUTION) ×2 IMPLANT

## 2021-12-10 NOTE — Transfer of Care (Signed)
Immediate Anesthesia Transfer of Care Note  Patient: Darrell Hughes  Procedure(s) Performed: EXPLORATION OF LEFT ARM WOUND and Left Radial Artery ligation. (Left: Arm Lower)  Patient Location: PACU  Anesthesia Type:General  Level of Consciousness: awake, alert  and oriented  Airway & Oxygen Therapy: Patient Spontanous Breathing and Patient connected to nasal cannula oxygen  Post-op Assessment: Report given to RN and Post -op Vital signs reviewed and stable  Post vital signs: Reviewed and stable  Last Vitals:  Vitals Value Taken Time  BP 115/87 12/10/21 1915  Temp    Pulse 75 12/10/21 1917  Resp 19 12/10/21 1917  SpO2 97 % 12/10/21 1917  Vitals shown include unvalidated device data.  Last Pain:  Vitals:   12/10/21 1649  TempSrc:   PainSc: 10-Worst pain ever         Complications: No notable events documented.

## 2021-12-10 NOTE — ED Notes (Signed)
Vascular paged to Dr. Suezanne Jacquet per his request

## 2021-12-10 NOTE — Anesthesia Procedure Notes (Signed)
Procedure Name: Intubation Date/Time: 12/10/2021 5:55 PM  Performed by: Trinna Post., CRNAPre-anesthesia Checklist: Patient identified, Emergency Drugs available, Suction available, Patient being monitored and Timeout performed Patient Re-evaluated:Patient Re-evaluated prior to induction Oxygen Delivery Method: Circle system utilized Preoxygenation: Pre-oxygenation with 100% oxygen Induction Type: IV induction, Rapid sequence and Cricoid Pressure applied Laryngoscope Size: Mac and 4 Grade View: Grade I Tube type: Oral Tube size: 7.5 mm Number of attempts: 1 Airway Equipment and Method: Stylet Placement Confirmation: ETT inserted through vocal cords under direct vision Secured at: 23 cm Tube secured with: Tape Dental Injury: Teeth and Oropharynx as per pre-operative assessment

## 2021-12-10 NOTE — ED Notes (Signed)
TOURNIQUET APPLIED BY [employer] @ 1558. TAKEN DOWN FOR LESS THAN A MINUTE BY ED MD AND THEN REPLACED.
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
## 2021-12-10 NOTE — ED Notes (Signed)
Tourniquet down by MD Scheving. Brisk bleeding noted per MD. Tourniquet re-placed immediately following assessment

## 2021-12-10 NOTE — ED Notes (Signed)
1 ENVELOPE VALUABLES LOCKED UP IN ED SECURITY. CONTAINS 1 CELL PHONE AND 2 METAL RINGS. 1 PAIR OF RED SHOES IN PURPLE ZONE BELONGINGS ROOMS
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
## 2021-12-10 NOTE — Plan of Care (Signed)
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
## 2021-12-10 NOTE — Anesthesia Postprocedure Evaluation (Signed)
Anesthesia Post Note  Patient: Darrell Hughes  Procedure(s) Performed: EXPLORATION OF LEFT ARM WOUND and Left Radial Artery ligation. (Left: Arm Lower)     Patient location during evaluation: PACU Anesthesia Type: General Level of consciousness: awake and alert, patient cooperative and oriented Pain management: pain level controlled Vital Signs Assessment: post-procedure vital signs reviewed and stable Respiratory status: spontaneous breathing, nonlabored ventilation and respiratory function stable Cardiovascular status: blood pressure returned to baseline and stable Postop Assessment: no apparent nausea or vomiting Anesthetic complications: no   No notable events documented.  Last Vitals:  Vitals:   12/10/21 1959 12/10/21 2026  BP:  (!) 142/94  Pulse: 76 87  Resp: 20 20  Temp: (!) 36.2 C 36.8 C  SpO2: 100% 97%    Last Pain:  Vitals:   12/10/21 2026  TempSrc: Oral  PainSc: 8                  Jeanae Whitmill,E. Reine Bristow
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
## 2021-12-11 ENCOUNTER — Encounter (HOSPITAL_COMMUNITY): Payer: Self-pay | Admitting: Vascular Surgery

## 2021-12-11 DIAGNOSIS — X789XXA Intentional self-harm by unspecified sharp object, initial encounter: Secondary | ICD-10-CM | POA: Diagnosis not present

## 2021-12-11 DIAGNOSIS — F319 Bipolar disorder, unspecified: Secondary | ICD-10-CM | POA: Diagnosis not present

## 2021-12-11 LAB — CBC
HCT: 39.9 % (ref 39.0–52.0)
Hemoglobin: 13.1 g/dL (ref 13.0–17.0)
MCH: 28.5 pg (ref 26.0–34.0)
MCHC: 32.8 g/dL (ref 30.0–36.0)
MCV: 86.7 fL (ref 80.0–100.0)
Platelets: 250 10*3/uL (ref 150–400)
RBC: 4.6 MIL/uL (ref 4.22–5.81)
RDW: 14.3 % (ref 11.5–15.5)
WBC: 22.3 10*3/uL — ABNORMAL HIGH (ref 4.0–10.5)
nRBC: 0 % (ref 0.0–0.2)

## 2021-12-11 LAB — BASIC METABOLIC PANEL
Anion gap: 14 (ref 5–15)
BUN: 11 mg/dL (ref 6–20)
CO2: 18 mmol/L — ABNORMAL LOW (ref 22–32)
Calcium: 9.1 mg/dL (ref 8.9–10.3)
Chloride: 106 mmol/L (ref 98–111)
Creatinine, Ser: 1.1 mg/dL (ref 0.61–1.24)
GFR, Estimated: >60 mL/min (ref >60)
Glucose, Bld: 129 mg/dL — ABNORMAL HIGH (ref 70–99)
Potassium: 4.3 mmol/L (ref 3.5–5.1)
Sodium: 138 mmol/L (ref 135–145)

## 2021-12-11 LAB — ABO/RH: ABO/RH(D): O POS

## 2021-12-11 MED ORDER — ALUM & MAG HYDROXIDE-SIMETH 200-200-20 MG/5ML PO SUSP
30.0000 mL | Freq: Four times a day (QID) | ORAL | Status: DC | PRN
Start: 2021-12-11 — End: 2021-12-15
  Administered 2021-12-11: 30 mL via ORAL
  Filled 2021-12-11: qty 30

## 2021-12-11 NOTE — Progress Notes (Signed)
Orthopedic Tech Progress Note Patient Details:  Darrell Hughes March 02, 1991 [id number]  Ortho Devices Type of Ortho Device: Arm sling Ortho Device/Splint Location: LUE Ortho Device/Splint Interventions: Ordered   Post Interventions Patient Tolerated: Well Instructions Provided: Care of device  [name] 12/11/2021, 2:03 PM
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
## 2021-12-11 NOTE — Assessment & Plan Note (Signed)
-  Continue home meds - Zoloft, Prazosin, and hydroxyzine -Patient denies suicidal ideation currently -Continue to follow psychiatry service recommendation. -Patient is IVC and will continue with sitter.

## 2021-12-11 NOTE — Progress Notes (Signed)
Patient received from PACU via bed. AO x4. Vitals stable at the time of arrival. CHG bath given, connected to tele and CCMD notified. Left arm in ace wrap and sling. Can wiggle his fingers, left hand warm to touch, cap refill less than 2sec. Left Brachial pulse 2+. 1:1 sitter for suicide precaution. Will continue to monitor.

## 2021-12-11 NOTE — Progress Notes (Addendum)
  Progress Note    12/11/2021 7:42 AM 1 Day Post-Op  Subjective:  Motor function of L hand has improved since injury yesterday   Vitals:   12/11/21 0351 12/11/21 0615  BP: (!) 131/91   Pulse: 77   Resp: 19 20  Temp: 98.4 F (36.9 C)   SpO2: 98%    Physical Exam: Lungs:  non labored Incisions:  L arm with dressing left in place Neurologic: motor deficit L hand  CBC    Component Value Date/Time   WBC 22.3 (H) 12/11/2021 0325   RBC 4.60 12/11/2021 0325   HGB 13.1 12/11/2021 0325   HCT 39.9 12/11/2021 0325   PLT 250 12/11/2021 0325   MCV 86.7 12/11/2021 0325   MCH 28.5 12/11/2021 0325   MCHC 32.8 12/11/2021 0325   RDW 14.3 12/11/2021 0325   LYMPHSABS 3.7 12/10/2021 1648   MONOABS 1.2 (H) 12/10/2021 1648   EOSABS 0.2 12/10/2021 1648   BASOSABS 0.1 12/10/2021 1648    BMET    Component Value Date/Time   NA 138 12/11/2021 0325   K 4.3 12/11/2021 0325   CL 106 12/11/2021 0325   CO2 18 (L) 12/11/2021 0325   GLUCOSE 129 (H) 12/11/2021 0325   BUN 11 12/11/2021 0325   CREATININE 1.10 12/11/2021 0325   CALCIUM 9.1 12/11/2021 0325   GFRNONAA >60 12/11/2021 0325   GFRAA >60 01/27/2020 1257    INR    Component Value Date/Time   INR 1.1 12/10/2021 1648     Intake/Output Summary (Last 24 hours) at 12/11/2021 0742 Last data filed at 12/11/2021 0600 Gross per 24 hour  Intake 2891.64 ml  Output 1425 ml  Net 1466.64 ml     Assessment/Plan:  31 y.o. male is s/p ligation of L radial artery 1 Day Post-Op   Subjectively, motor function improving L hand Continue current dressing today; will start iodoform gauze packing tomorrow Pt/OT to evaluate    Emilie Rutter, PA-C [facility] [phone] 12/11/2021 7:42 AM   VASCULAR STAFF ADDENDUM: I have independently interviewed and examined the patient. I agree with the above.  Will ask hand surgery to formally evaluate patient today. OK to take bandage down for complete exam.  Will ask  psychiatry to evaluate patient today.  Anticipate discharge to inpatient psychiatry facility. Hand is warm and well perfused today.  [name]. [name], MD [facility] Phone Number: [phone] 12/11/2021 9:10 AM

## 2021-12-11 NOTE — Assessment & Plan Note (Signed)
-  s/p wound exploration and ligation of the left radial artery -Surgical management per vascular -Based on obvious attempt, continue IVC paperwork while awaiting psychiatric evaluation -He appears likely to benefit from inpatient psychiatric hospitalization -[facility] will assume care on 8/1 since he is now stable from a trauma/surgical standpoint
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
## 2021-12-12 ENCOUNTER — Inpatient Hospital Stay (HOSPITAL_COMMUNITY): Payer: Medicaid Other | Admitting: Certified Registered Nurse Anesthetist

## 2021-12-12 ENCOUNTER — Encounter (HOSPITAL_COMMUNITY): Payer: Self-pay | Admitting: Vascular Surgery

## 2021-12-12 ENCOUNTER — Other Ambulatory Visit: Payer: Self-pay

## 2021-12-12 ENCOUNTER — Encounter (HOSPITAL_COMMUNITY)
Admission: EM | Disposition: A | Discharge status: Other Acute Inpatient Hospital | Payer: Self-pay | Source: Home / Self Care | Attending: Internal Medicine

## 2021-12-12 DIAGNOSIS — N179 Acute kidney failure, unspecified: Secondary | ICD-10-CM | POA: Diagnosis not present

## 2021-12-12 DIAGNOSIS — K219 Gastro-esophageal reflux disease without esophagitis: Secondary | ICD-10-CM

## 2021-12-12 DIAGNOSIS — R739 Hyperglycemia, unspecified: Secondary | ICD-10-CM

## 2021-12-12 DIAGNOSIS — F319 Bipolar disorder, unspecified: Secondary | ICD-10-CM | POA: Diagnosis not present

## 2021-12-12 DIAGNOSIS — X789XXA Intentional self-harm by unspecified sharp object, initial encounter: Secondary | ICD-10-CM | POA: Diagnosis not present

## 2021-12-12 DIAGNOSIS — S59912A Unspecified injury of left forearm, initial encounter: Secondary | ICD-10-CM

## 2021-12-12 DIAGNOSIS — E872 Acidosis, unspecified: Secondary | ICD-10-CM

## 2021-12-12 HISTORY — PX: NERVE AND TENDON REPAIR: SHX5693

## 2021-12-12 LAB — HEMOGLOBIN A1C
Hgb A1c MFr Bld: 5.4 % (ref 4.8–5.6)
Mean Plasma Glucose: 108.28 mg/dL

## 2021-12-12 LAB — SURGICAL PCR SCREEN
MRSA, PCR: NEGATIVE
Staphylococcus aureus: NEGATIVE

## 2021-12-12 SURGERY — NERVE AND TENDON REPAIR
Anesthesia: General | Site: Arm Lower | Laterality: Left

## 2021-12-12 MED ORDER — FENTANYL CITRATE (PF) 250 MCG/5ML IJ SOLN
INTRAMUSCULAR | Status: DC | PRN
  Administered 2021-12-12 (×2): 25 ug via INTRAVENOUS
  Administered 2021-12-12 (×3): 50 ug via INTRAVENOUS
  Administered 2021-12-12 (×2): 25 ug via INTRAVENOUS

## 2021-12-12 MED ORDER — PROPOFOL 10 MG/ML IV BOLUS
INTRAVENOUS | Status: AC
  Filled 2021-12-12: qty 20

## 2021-12-12 MED ORDER — BUPIVACAINE HCL (PF) 0.25 % IJ SOLN
INTRAMUSCULAR | Status: DC | PRN
  Administered 2021-12-12: 10 mL

## 2021-12-12 MED ORDER — BACITRACIN ZINC 500 UNIT/GM EX OINT
TOPICAL_OINTMENT | CUTANEOUS | Status: DC | PRN
  Administered 2021-12-12: 1 via TOPICAL

## 2021-12-12 MED ORDER — DEXMEDETOMIDINE HCL IN NACL 200 MCG/50ML IV SOLN: INTRAVENOUS | Status: DC | PRN

## 2021-12-12 MED ORDER — FENTANYL CITRATE (PF) 100 MCG/2ML IJ SOLN
25.0000 ug | INTRAMUSCULAR | Status: DC | PRN
  Administered 2021-12-12: 50 ug via INTRAVENOUS

## 2021-12-12 MED ORDER — SODIUM CHLORIDE 0.9 % IR SOLN
Status: DC | PRN
  Administered 2021-12-12: 3000 mL

## 2021-12-12 MED ORDER — CHLORHEXIDINE GLUCONATE 0.12 % MT SOLN
OROMUCOSAL | Status: AC
  Administered 2021-12-12: 15 mL via OROMUCOSAL
  Filled 2021-12-12: qty 15

## 2021-12-12 MED ORDER — CHLORHEXIDINE GLUCONATE 0.12 % MT SOLN: 15.0000 mL | Freq: Once | OROMUCOSAL | Status: AC

## 2021-12-12 MED ORDER — PROPOFOL 10 MG/ML IV BOLUS
INTRAVENOUS | Status: DC | PRN
  Administered 2021-12-12: 200 mg via INTRAVENOUS

## 2021-12-12 MED ORDER — FENTANYL CITRATE (PF) 250 MCG/5ML IJ SOLN
INTRAMUSCULAR | Status: AC
  Filled 2021-12-12: qty 5

## 2021-12-12 MED ORDER — KETOROLAC TROMETHAMINE 30 MG/ML IJ SOLN
INTRAMUSCULAR | Status: AC
  Filled 2021-12-12: qty 1

## 2021-12-12 MED ORDER — BACITRACIN ZINC 500 UNIT/GM EX OINT
TOPICAL_OINTMENT | CUTANEOUS | Status: AC
Start: 2021-12-12 — End: ?
  Filled 2021-12-12: qty 28.35

## 2021-12-12 MED ORDER — SODIUM CHLORIDE 0.9 % IV SOLN: INTRAVENOUS | Status: DC

## 2021-12-12 MED ORDER — DEXMEDETOMIDINE (PRECEDEX) IN NS 20 MCG/5ML (4 MCG/ML) IV SYRINGE
PREFILLED_SYRINGE | INTRAVENOUS | Status: DC | PRN
  Administered 2021-12-12: 8 ug via INTRAVENOUS
  Administered 2021-12-12 (×2): 12 ug via INTRAVENOUS

## 2021-12-12 MED ORDER — ACETAMINOPHEN 10 MG/ML IV SOLN
1000.0000 mg | Freq: Once | INTRAVENOUS | Status: DC | PRN
Start: 2021-12-12 — End: 2021-12-12

## 2021-12-12 MED ORDER — BUPIVACAINE HCL (PF) 0.25 % IJ SOLN
INTRAMUSCULAR | Status: AC
  Filled 2021-12-12: qty 30

## 2021-12-12 MED ORDER — LIDOCAINE 2% (20 MG/ML) 5 ML SYRINGE
INTRAMUSCULAR | Status: DC | PRN
  Administered 2021-12-12: 100 mg via INTRAVENOUS

## 2021-12-12 MED ORDER — OXYCODONE HCL 5 MG/5ML PO SOLN: 5.0000 mg | Freq: Once | ORAL | Status: DC | PRN

## 2021-12-12 MED ORDER — CEFAZOLIN SODIUM 1 G IJ SOLR
INTRAMUSCULAR | Status: AC
  Filled 2021-12-12: qty 10

## 2021-12-12 MED ORDER — ACETAMINOPHEN 160 MG/5ML PO SOLN: 1000.0000 mg | Freq: Once | ORAL | Status: DC | PRN

## 2021-12-12 MED ORDER — CEFAZOLIN SODIUM-DEXTROSE 2-3 GM-%(50ML) IV SOLR
INTRAVENOUS | Status: DC | PRN
  Administered 2021-12-12: 1 g via INTRAVENOUS
  Administered 2021-12-12: 2 g via INTRAVENOUS

## 2021-12-12 MED ORDER — LIDOCAINE 2% (20 MG/ML) 5 ML SYRINGE
INTRAMUSCULAR | Status: AC
  Filled 2021-12-12: qty 5

## 2021-12-12 MED ORDER — CEFAZOLIN SODIUM-DEXTROSE 2-4 GM/100ML-% IV SOLN
INTRAVENOUS | Status: AC
  Filled 2021-12-12: qty 100

## 2021-12-12 MED ORDER — LACTATED RINGERS IV SOLN: INTRAVENOUS | Status: DC

## 2021-12-12 MED ORDER — ONDANSETRON HCL 4 MG/2ML IJ SOLN
INTRAMUSCULAR | Status: DC | PRN
  Administered 2021-12-12: 4 mg via INTRAVENOUS

## 2021-12-12 MED ORDER — OXYCODONE HCL 5 MG PO TABS: 5.0000 mg | ORAL_TABLET | Freq: Once | ORAL | Status: DC | PRN

## 2021-12-12 MED ORDER — ORAL CARE MOUTH RINSE: 15.0000 mL | Freq: Once | OROMUCOSAL | Status: AC

## 2021-12-12 MED ORDER — ACETAMINOPHEN 500 MG PO TABS: 1000.0000 mg | ORAL_TABLET | Freq: Once | ORAL | Status: DC | PRN

## 2021-12-12 MED ORDER — FENTANYL CITRATE (PF) 100 MCG/2ML IJ SOLN
INTRAMUSCULAR | Status: AC
  Filled 2021-12-12: qty 2

## 2021-12-12 MED ORDER — 0.9 % SODIUM CHLORIDE (POUR BTL) OPTIME
TOPICAL | Status: DC | PRN
  Administered 2021-12-12: 1000 mL

## 2021-12-12 MED ORDER — KETOROLAC TROMETHAMINE 15 MG/ML IJ SOLN
INTRAMUSCULAR | Status: DC | PRN
  Administered 2021-12-12: 30 mg via INTRAVENOUS

## 2021-12-12 MED ORDER — ONDANSETRON HCL 4 MG/2ML IJ SOLN
INTRAMUSCULAR | Status: AC
  Filled 2021-12-12: qty 2

## 2021-12-12 SURGICAL SUPPLY — 60 items
BAG COUNTER SPONGE SURGICOUNT (BAG) ×1 IMPLANT
BAG SPNG CNTER NS LX DISP (BAG)
BNDG CMPR 75X41 PLY ABS (GAUZE/BANDAGES/DRESSINGS)
BNDG CMPR 9X4 STRL LF SNTH (GAUZE/BANDAGES/DRESSINGS) ×1
BNDG ELASTIC 3X5.8 VLCR STR LF (GAUZE/BANDAGES/DRESSINGS) ×1 IMPLANT
BNDG ELASTIC 4X5.8 VLCR STR LF (GAUZE/BANDAGES/DRESSINGS) ×2 IMPLANT
BNDG ESMARK 4X9 LF (GAUZE/BANDAGES/DRESSINGS) ×2 IMPLANT
BNDG STRETCH 4X75 NS LF (GAUZE/BANDAGES/DRESSINGS) IMPLANT
CORD BIPOLAR FORCEPS 12FT (ELECTRODE) ×2 IMPLANT
COVER SURGICAL LIGHT HANDLE (MISCELLANEOUS) ×2 IMPLANT
CUFF TOURN SGL QUICK 18X4 (TOURNIQUET CUFF) ×2 IMPLANT
DRAPE SURG 17X23 STRL (DRAPES) ×2 IMPLANT
DRSG ADAPTIC 3X8 NADH LF (GAUZE/BANDAGES/DRESSINGS) ×3 IMPLANT
ELECT REM PT RETURN 9FT ADLT (ELECTROSURGICAL)
ELECTRODE REM PT RTRN 9FT ADLT (ELECTROSURGICAL) IMPLANT
GAUZE SPONGE 4X4 12PLY STRL (GAUZE/BANDAGES/DRESSINGS) ×2 IMPLANT
GLOVE BIO SURGEON STRL SZ7.5 (GLOVE) ×2 IMPLANT
GLOVE SURG UNDER POLY LF SZ7.5 (GLOVE) ×3 IMPLANT
GOWN STRL REUS W/ TWL LRG LVL3 (GOWN DISPOSABLE) ×3 IMPLANT
GOWN STRL REUS W/ TWL XL LVL3 (GOWN DISPOSABLE) ×1 IMPLANT
GOWN STRL REUS W/TWL LRG LVL3 (GOWN DISPOSABLE) ×2
GOWN STRL REUS W/TWL XL LVL3 (GOWN DISPOSABLE) ×2
HANDPIECE INTERPULSE COAX TIP (DISPOSABLE)
KIT BASIN OR (CUSTOM PROCEDURE TRAY) ×2 IMPLANT
KIT REMOVER STAPLE SKIN (MISCELLANEOUS) ×1 IMPLANT
KIT TURNOVER KIT B (KITS) ×2 IMPLANT
MANIFOLD NEPTUNE II (INSTRUMENTS) ×2 IMPLANT
NDL HYPO 25GX1X1/2 BEV (NEEDLE) IMPLANT
NEEDLE HYPO 25GX1X1/2 BEV (NEEDLE) ×2 IMPLANT
NS IRRIG 1000ML POUR BTL (IV SOLUTION) ×2 IMPLANT
PACK ORTHO EXTREMITY (CUSTOM PROCEDURE TRAY) ×2 IMPLANT
PAD ARMBOARD 7.5X6 YLW CONV (MISCELLANEOUS) ×4 IMPLANT
PAD CAST 3X4 CTTN HI CHSV (CAST SUPPLIES) IMPLANT
PAD CAST 4YDX4 CTTN HI CHSV (CAST SUPPLIES) ×1 IMPLANT
PADDING CAST COTTON 3X4 STRL (CAST SUPPLIES)
PADDING CAST COTTON 4X4 STRL (CAST SUPPLIES) ×2
SET CYSTO W/LG BORE CLAMP LF (SET/KITS/TRAYS/PACK) ×1 IMPLANT
SET HNDPC FAN SPRY TIP SCT (DISPOSABLE) IMPLANT
SOAP 2 % CHG 4 OZ (WOUND CARE) ×2 IMPLANT
SPONGE T-LAP 18X18 ~~LOC~~+RFID (SPONGE) ×2 IMPLANT
SPONGE T-LAP 4X18 ~~LOC~~+RFID (SPONGE) ×2 IMPLANT
SUT CHROMIC 3 0 PS 2 (SUTURE) ×3 IMPLANT
SUT ETHIBOND NAB MH 2-0 36IN (SUTURE) ×1 IMPLANT
SUT ETHIBOND X763 2 0 SH 1 (SUTURE) ×1 IMPLANT
SUT ETHILON 4 0 PS 2 18 (SUTURE) IMPLANT
SUT ETHILON 5 0 P 3 18 (SUTURE)
SUT ETHILON 6 0 P 1 (SUTURE) ×1 IMPLANT
SUT ETHILON 8 0 BV130 4 (SUTURE) ×1 IMPLANT
SUT NYLON ETHILON 5-0 P-3 1X18 (SUTURE) IMPLANT
SUT SILK 2 0 (SUTURE) ×2
SUT SILK 2-0 18XBRD TIE 12 (SUTURE) IMPLANT
SWAB COLLECTION DEVICE MRSA (MISCELLANEOUS) ×1 IMPLANT
SWAB CULTURE ESWAB REG 1ML (MISCELLANEOUS) IMPLANT
SYR CONTROL 10ML LL (SYRINGE) ×1 IMPLANT
TOWEL GREEN STERILE (TOWEL DISPOSABLE) ×2 IMPLANT
TOWEL GREEN STERILE FF (TOWEL DISPOSABLE) ×2 IMPLANT
TUBE CONNECTING 12X1/4 (SUCTIONS) ×2 IMPLANT
UNDERPAD 30X36 HEAVY ABSORB (UNDERPADS AND DIAPERS) ×2 IMPLANT
WATER STERILE IRR 1000ML POUR (IV SOLUTION) ×1 IMPLANT
YANKAUER SUCT BULB TIP NO VENT (SUCTIONS) ×2 IMPLANT

## 2021-12-12 NOTE — OR Nursing (Signed)
Care of patient assumed at 1912. 

## 2021-12-12 NOTE — Progress Notes (Signed)
Sp Theora Gianotti called back and she was made aware of his mother consenting since she could not be contacted.

## 2021-12-12 NOTE — Assessment & Plan Note (Signed)
-  in the setting of pre-renal azotemia most likely -continue to maintain adequate hydration -minimize nephrotoxic agents -follow renal function trend. -after IVF's  Cr down to 1.10; was 1.42 at time of admission.

## 2021-12-12 NOTE — Plan of Care (Signed)
Pt alert and oriented x 4. Up adlib. Pt Currently NPO pending possible surgery this am. Pt has received 2 doses of morphine, 1 dose of percocet, and 1 dose of atarax this shift. Arm elevated and in sling. Vitals stable.  Problem: Education: Goal: Knowledge of General Education information will improve Description: Including pain rating scale, medication(s)/side effects and non-pharmacologic comfort measures Outcome: Progressing   Problem: Health Behavior/Discharge Planning: Goal: Ability to manage health-related needs will improve Outcome: Progressing   Problem: Clinical Measurements: Goal: Ability to maintain clinical measurements within normal limits will improve Outcome: Progressing Goal: Will remain free from infection Outcome: Progressing Goal: Diagnostic test results will improve Outcome: Progressing Goal: Respiratory complications will improve Outcome: Progressing Goal: Cardiovascular complication will be avoided Outcome: Progressing   Problem: Activity: Goal: Risk for activity intolerance will decrease Outcome: Progressing   Problem: Nutrition: Goal: Adequate nutrition will be maintained Outcome: Progressing   Problem: Coping: Goal: Level of anxiety will decrease Outcome: Progressing   Problem: Elimination: Goal: Will not experience complications related to bowel motility Outcome: Progressing Goal: Will not experience complications related to urinary retention Outcome: Progressing   Problem: Pain Managment: Goal: General experience of comfort will improve Outcome: Progressing   Problem: Safety: Goal: Ability to remain free from injury will improve Outcome: Progressing   Problem: Skin Integrity: Goal: Risk for impaired skin integrity will decrease Outcome: Progressing

## 2021-12-12 NOTE — Progress Notes (Addendum)
  Progress Note    12/12/2021 7:34 AM 2 Days Post-Op  Subjective:  no complaints this morning   Vitals:   12/12/21 0424 12/12/21 0718  BP: (!) 148/88 123/65  Pulse: 64 87  Resp: 20 20  Temp: 98.5 F (36.9 C) 98.3 F (36.8 C)  SpO2: 100% 100%   Physical Exam: Lungs:  non labored Incisions:  L ulnar and palmar arch brisk by doppler Neurologic: A&O  CBC    Component Value Date/Time   WBC 22.3 (H) 12/11/2021 0325   RBC 4.60 12/11/2021 0325   HGB 13.1 12/11/2021 0325   HCT 39.9 12/11/2021 0325   PLT 250 12/11/2021 0325   MCV 86.7 12/11/2021 0325   MCH 28.5 12/11/2021 0325   MCHC 32.8 12/11/2021 0325   RDW 14.3 12/11/2021 0325   LYMPHSABS 3.7 12/10/2021 1648   MONOABS 1.2 (H) 12/10/2021 1648   EOSABS 0.2 12/10/2021 1648   BASOSABS 0.1 12/10/2021 1648    BMET    Component Value Date/Time   NA 138 12/11/2021 0325   K 4.3 12/11/2021 0325   CL 106 12/11/2021 0325   CO2 18 (L) 12/11/2021 0325   GLUCOSE 129 (H) 12/11/2021 0325   BUN 11 12/11/2021 0325   CREATININE 1.10 12/11/2021 0325   CALCIUM 9.1 12/11/2021 0325   GFRNONAA >60 12/11/2021 0325   GFRAA >60 01/27/2020 1257    INR    Component Value Date/Time   INR 1.1 12/10/2021 1648     Intake/Output Summary (Last 24 hours) at 12/12/2021 0734 Last data filed at 12/11/2021 0745 Gross per 24 hour  Intake 360 ml  Output --  Net 360 ml     Assessment/Plan:  31 y.o. male is s/p ligation of L radial artery 2 Days Post-Op   L hand well perfused with brisk L ulnar and palmar arch by doppler Plans noted for re-exploration in OR with Dr. Yehuda Budd this afternoon Psychiatry recommending inpatient psych facility Appreciate [facility] assuming care today   Emilie Rutter, PA-C [facility] [phone] 12/12/2021 7:34 AM  VASCULAR STAFF ADDENDUM: I have independently interviewed and examined the patient. I agree with the above.  Appreciate Dr. Roberts Gaudy help. Reassuring vascular exam. We will  follow peripherally. Please call for questions.  [name]. [name], MD [facility] Phone Number: [phone] 12/12/2021 8:53 AM

## 2021-12-12 NOTE — Anesthesia Procedure Notes (Signed)
Procedure Name: LMA Insertion Date/Time: 12/12/2021 5:08 PM  Performed by: Cy Blamer, CRNAPre-anesthesia Checklist: Patient identified, Emergency Drugs available, Suction available, Patient being monitored and Timeout performed Patient Re-evaluated:Patient Re-evaluated prior to induction Oxygen Delivery Method: Circle system utilized Preoxygenation: Pre-oxygenation with 100% oxygen Induction Type: IV induction LMA: LMA inserted LMA Size: 5.0 Placement Confirmation: positive ETCO2 and breath sounds checked- equal and bilateral Tube secured with: Tape
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
## 2021-12-12 NOTE — Assessment & Plan Note (Addendum)
-  most likely associated with ARF -continue to maintain adequate hydration -follow trend.

## 2021-12-12 NOTE — Assessment & Plan Note (Signed)
-  no prior hx of diabetes -will check A1C -continue to maintain adequate hydration -follow CBG's

## 2021-12-12 NOTE — Transfer of Care (Signed)
Immediate Anesthesia Transfer of Care Note  Patient: Darrell Hughes  Procedure(s) Performed: LEFT FOREARM EXPLORATION WITH POSSIBLE NERVE AND TENDON REPAIR (Left: Arm Lower)  Patient Location: PACU  Anesthesia Type:General  Level of Consciousness: awake, alert  and oriented  Airway & Oxygen Therapy: Patient Spontanous Breathing and Patient connected to face mask oxygen  Post-op Assessment: Report given to RN and Post -op Vital signs reviewed and stable  Post vital signs: Reviewed and stable  Last Vitals:  Vitals Value Taken Time  BP 161/89 12/12/21 1930  Temp    Pulse 82 12/12/21 1934  Resp 19 12/12/21 1934  SpO2 98 % 12/12/21 1934  Vitals shown include unvalidated device data.  Last Pain:  Vitals:   12/12/21 1641  TempSrc:   PainSc: 5       Patients Stated Pain Goal: 0 (12/11/21 0615)  Complications: No notable events documented.

## 2021-12-12 NOTE — Assessment & Plan Note (Signed)
continue PPI

## 2021-12-12 NOTE — Plan of Care (Signed)
  Problem: Education: Goal: Knowledge of General Education information will improve Description: Including pain rating scale, medication(s)/side effects and non-pharmacologic comfort measures Outcome: Progressing   Problem: Health Behavior/Discharge Planning: Goal: Ability to manage health-related needs will improve Outcome: Progressing   Problem: Clinical Measurements: Goal: Will remain free from infection Outcome: Progressing   Problem: Pain Managment: Goal: General experience of comfort will improve Outcome: Progressing   Problem: Safety: Goal: Ability to remain free from injury will improve Outcome: Progressing   
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
## 2021-12-12 NOTE — Progress Notes (Signed)
Telephone consent received from his mom Aneesh Faller [phone], due to the pt being IVC. Attempted to call his wife Linna Darner [phone], but she did not answer.

## 2021-12-12 NOTE — Progress Notes (Signed)
   12/12/21 2053  Vitals  Temp 97.9 F (36.6 C)  Temp Source Oral  BP 137/74  MAP (mmHg) 91  BP Location Right Arm  BP Method Automatic  Patient Position (if appropriate) Lying  Pulse Rate 74  Pulse Rate Source Monitor  ECG Heart Rate 74  Resp (!) 25  Level of Consciousness  Level of Consciousness Alert  MEWS COLOR  MEWS Score Color Green  Oxygen Therapy  SpO2 94 %  O2 Device Room Air  MEWS Score  MEWS Temp 0  MEWS Systolic 0  MEWS Pulse 0  MEWS RR 1  MEWS LOC 0  MEWS Score 1   Pt transported to 4E07 from PACU. Pt denies pain. Pt placed back on telemetry. Sitter in pt room. RN will continue to monitor pt.

## 2021-12-12 NOTE — Progress Notes (Signed)
Plan for left forearm exploration possible tendon, nerve repair. Deficits on exam are FDS 2,3,4, Median nerve and EDC 3.   The risks and benefits of surgery were carefully explained including, but not limited to risks of infection, injury to nerves, blood vessels, neighboring structures, recurrence or continued symptoms, loss of motion or strength and the need for rehabilitation or further surgery. After thorough discussion and all questions were answered informed consent was obtained.  Mathis Dad, MD Orthopaedic Hand Surgeon
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
## 2021-12-13 ENCOUNTER — Encounter (HOSPITAL_COMMUNITY): Payer: Self-pay | Admitting: Orthopedic Surgery

## 2021-12-13 ENCOUNTER — Inpatient Hospital Stay (HOSPITAL_COMMUNITY): Admission: AD | Admit: 2021-12-13 | Payer: Medicaid Other | Source: Intra-hospital | Admitting: Psychiatry

## 2021-12-13 ENCOUNTER — Telehealth: Payer: Self-pay | Admitting: Vascular Surgery

## 2021-12-13 DIAGNOSIS — Z9889 Other specified postprocedural states: Secondary | ICD-10-CM

## 2021-12-13 DIAGNOSIS — S51812A Laceration without foreign body of left forearm, initial encounter: Secondary | ICD-10-CM | POA: Diagnosis not present

## 2021-12-13 DIAGNOSIS — N179 Acute kidney failure, unspecified: Secondary | ICD-10-CM | POA: Diagnosis not present

## 2021-12-13 DIAGNOSIS — D62 Acute posthemorrhagic anemia: Secondary | ICD-10-CM | POA: Diagnosis not present

## 2021-12-13 DIAGNOSIS — R45851 Suicidal ideations: Secondary | ICD-10-CM

## 2021-12-13 DIAGNOSIS — X789XXA Intentional self-harm by unspecified sharp object, initial encounter: Secondary | ICD-10-CM | POA: Diagnosis not present

## 2021-12-13 LAB — CBC
HCT: 29.2 % — ABNORMAL LOW (ref 39.0–52.0)
Hemoglobin: 9.8 g/dL — ABNORMAL LOW (ref 13.0–17.0)
MCH: 29.2 pg (ref 26.0–34.0)
MCHC: 33.6 g/dL (ref 30.0–36.0)
MCV: 86.9 fL (ref 80.0–100.0)
Platelets: 194 10*3/uL (ref 150–400)
RBC: 3.36 MIL/uL — ABNORMAL LOW (ref 4.22–5.81)
RDW: 14.3 % (ref 11.5–15.5)
WBC: 12.5 10*3/uL — ABNORMAL HIGH (ref 4.0–10.5)
nRBC: 0 % (ref 0.0–0.2)

## 2021-12-13 LAB — BASIC METABOLIC PANEL
Anion gap: 5 (ref 5–15)
BUN: 11 mg/dL (ref 6–20)
CO2: 26 mmol/L (ref 22–32)
Calcium: 8.1 mg/dL — ABNORMAL LOW (ref 8.9–10.3)
Chloride: 106 mmol/L (ref 98–111)
Creatinine, Ser: 1.08 mg/dL (ref 0.61–1.24)
GFR, Estimated: >60 mL/min (ref >60)
Glucose, Bld: 115 mg/dL — ABNORMAL HIGH (ref 70–99)
Potassium: 3.7 mmol/L (ref 3.5–5.1)
Sodium: 137 mmol/L (ref 135–145)

## 2021-12-13 LAB — LACTIC ACID, PLASMA: Lactic Acid, Venous: 0.7 mmol/L (ref 0.5–1.9)

## 2021-12-13 LAB — SARS CORONAVIRUS 2 BY RT PCR: SARS Coronavirus 2 by RT PCR: NEGATIVE
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
## 2021-12-13 NOTE — Progress Notes (Signed)
Attempted given report to [facility] at  [phone]. The facility refused taking the report this time, requested to receive the report later as night time admission. Will pass on to night shift nurse.   Lawson Radar, RN
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
## 2021-12-13 NOTE — Telephone Encounter (Signed)
-----   Message from Leonie Douglas, MD sent at 12/10/2021  8:02 PM EDT ----- Darrell Hughes 12/10/2021 Procedure: Left forearm exploration Ligation of left radial artery Assistant: eveland Follow up: 2-3 weeks with PA Studies for follow up: none  Thank you! Elijah Birk

## 2021-12-13 NOTE — Progress Notes (Signed)
Changed dressing on L arm per MD order.   Lawson Radar, RN

## 2021-12-13 NOTE — TOC Transition Note (Signed)
Transition of Care [facility]) - CM/SW Discharge Note   Patient Details  Name: Rashard Ryle MRN: [id number] Date of Birth: March 15, 1991  Transition of Care [facility]) CM/SW Contact:  Erin Sons, LCSW Phone Number: 12/13/2021, 2:52 PM   Clinical Narrative:     CSW is informed that patient is accepted at [facility] pending negative covid result. IVC papers have been faxed to [facility] at [phone] as requested. Patient can admit to [facility] at 2100 this evening.   RN will need to call for law enforcement transport at at 2045. Call [phone] and select option 3 for non emergency transport when prompted. Request for transport of an IVC'd patient. IVC papers have already been served  and signed by an Technical sales engineer; IVC papers  are located on the chart.   Pt will be going to [facility] [address] [place]. Bed assignment is [id number]. Nurse to call report to [phone].  TOC will sign off; please reach out if there are any additional needs.    Final next level of care: Psychiatric Hospital Barriers to Discharge: No Barriers Identified   Patient Goals and CMS Choice        Discharge Placement              Patient chooses bed at:  [facility]) Patient to be transferred to facility by: Law enforcement      Discharge Plan and Services                                     Social Determinants of Health (SDOH) Interventions     Readmission Risk Interventions     No data to display
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
## 2021-12-14 NOTE — Progress Notes (Signed)
2043  Pt's transfer to [facility] put on hold per Oncall MD order.

## 2021-12-14 NOTE — Plan of Care (Signed)
  Problem: Education: Goal: Knowledge of General Education information will improve Description: Including pain rating scale, medication(s)/side effects and non-pharmacologic comfort measures Outcome: Progressing   Problem: Health Behavior/Discharge Planning: Goal: Ability to manage health-related needs will improve Outcome: Progressing   Problem: Clinical Measurements: Goal: Will remain free from infection Outcome: Progressing   Problem: Elimination: Goal: Will not experience complications related to bowel motility Outcome: Progressing   

## 2021-12-14 NOTE — Progress Notes (Signed)
MD clarified no need for EMTALA.  Pt taken in [facility] to transport with deputy

## 2021-12-14 NOTE — TOC Transition Note (Addendum)
Transition of Care [facility]) - CM/SW Discharge Note   Patient Details  Name: Darrell Hughes MRN: [id number] Date of Birth: [birth year]  Transition of Care [facility]) CM/SW Contact:  Eduard Roux, LCSW Phone Number: 12/14/2021, 1:23 PM   Clinical Narrative:     Patient will Discharge to: [name]  Discharge Date: 12/14/2021 Family Notified: Transport By: Rise Paganini Department [phone] been contacted for transport IVC paperwork & D/C summary has been included in d/c packet.   Admitting MD: Linus Mako  Building: [facility] Every RN Report Number: [phone]  Clinical Social Worker signing off.  Antony Blackbird, MSW, LCSW Clinical Social Worker     Final next level of care: Psychiatric Hospital Barriers to Discharge: No Barriers Identified   Patient Goals and CMS Choice        Discharge Placement              Patient chooses bed at:  [name] Patient to be transferred to facility by: Law enforcement      Discharge Plan and Services                                     Social Determinants of Health (SDOH) Interventions     Readmission Risk Interventions     No data to display

## 2021-12-14 NOTE — Anesthesia Postprocedure Evaluation (Signed)
Anesthesia Post Note  Patient: Darrell Hughes  Procedure(s) Performed: LEFT FOREARM EXPLORATION WITH NERVE AND TENDON REPAIR. (Left: Arm Lower)     Patient location during evaluation: PACU Anesthesia Type: General Level of consciousness: awake and alert Pain management: pain level controlled Vital Signs Assessment: post-procedure vital signs reviewed and stable Respiratory status: spontaneous breathing, nonlabored ventilation, respiratory function stable and patient connected to nasal cannula oxygen Cardiovascular status: blood pressure returned to baseline and stable Postop Assessment: no apparent nausea or vomiting Anesthetic complications: no   No notable events documented.  Last Vitals:  Vitals:   12/14/21 0000 12/14/21 0409  BP: (!) 145/82 123/71  Pulse: 67 70  Resp: 16 20  Temp: 37 C 36.8 C  SpO2: 100% 92%    Last Pain:  Vitals:   12/14/21 0626  TempSrc:   PainSc: 9                  Darrell Hughes P [place] 

## 2021-12-14 NOTE — Progress Notes (Signed)
Report given to [place] at [facility].  Pts belongings retrieved from security office.  Deputy here to transport pt at this time however EMTALA form is not complete/printable.  MD paged.

## 2021-12-14 NOTE — Progress Notes (Signed)
Attempted to call report at number provided in CSW note : [phone].  No answer at this time.  Pt aware of plan for transfer.

## 2023-02-04 IMAGING — CT CT ABD-PELV W/ CM
2 of 4 series · 16 of 46 positions shown, 18 images · IV contrast (APPLIED)
Comparison: December 30, 2020

CLINICAL DATA: Lower abdominal pain and lower back pain.

EXAM:
CT ABDOMEN AND PELVIS WITH CONTRAST
TECHNIQUE: Multidetector CT imaging of the abdomen and pelvis was performed
using the standard protocol following bolus administration of
intravenous contrast.
CONTRAST:  80mL OMNIPAQUE IOHEXOL 350 MG/ML SOLN

[Series 2: routine abd/pel with · axial · 0.96mm/px · z∈[-533,-43]mm · 13 of 108 slices shown, 15 images]
[im 5/108  soft-tissue]
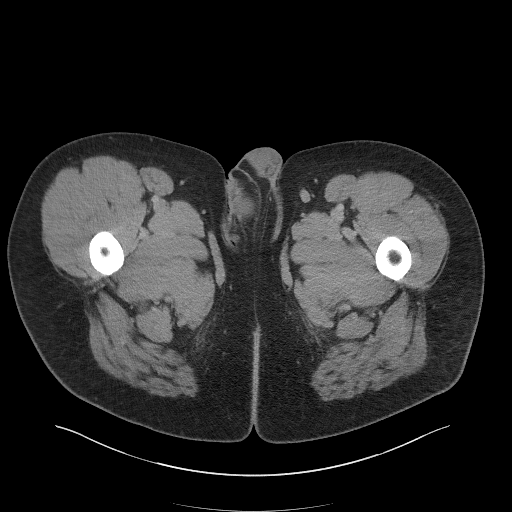
[im 5/108  bone]
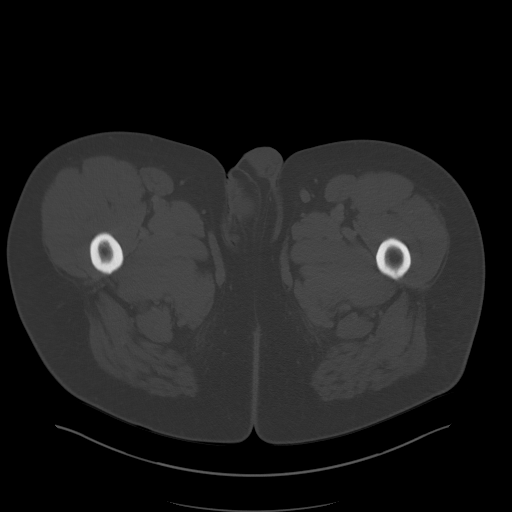
[im 14/108  soft-tissue]
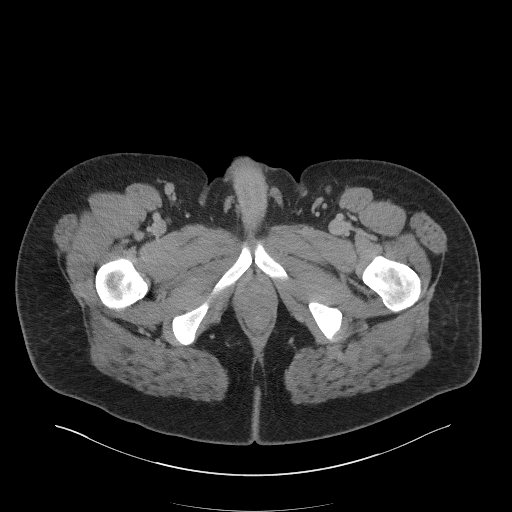
[im 23/108  soft-tissue]
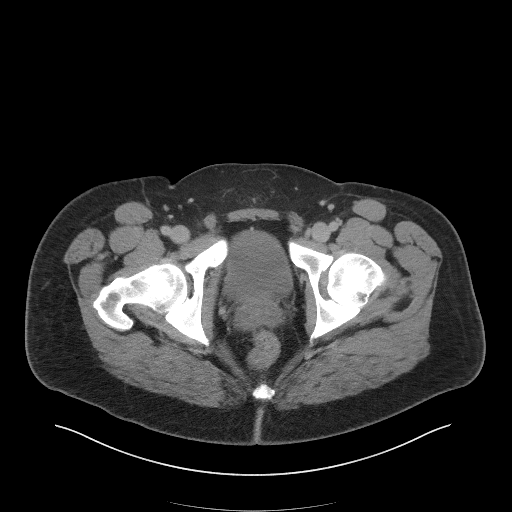
[im 32/108  soft-tissue]
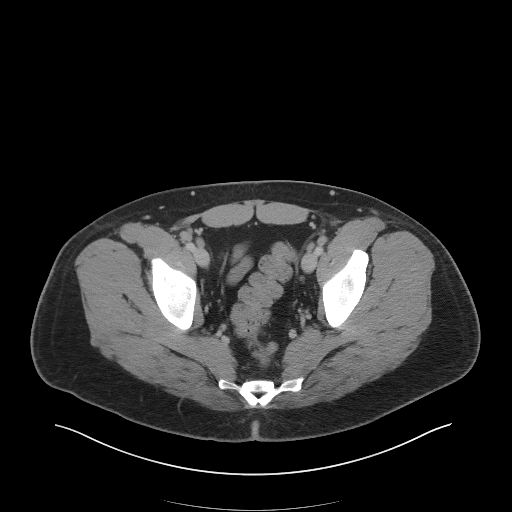
[im 36/108  soft-tissue]
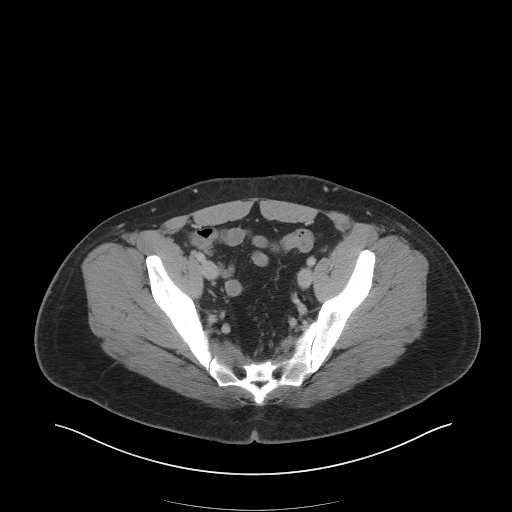
[im 45/108  soft-tissue]
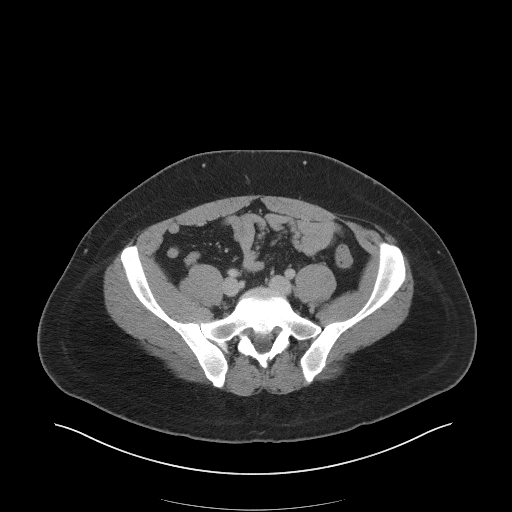
[im 54/108  soft-tissue]
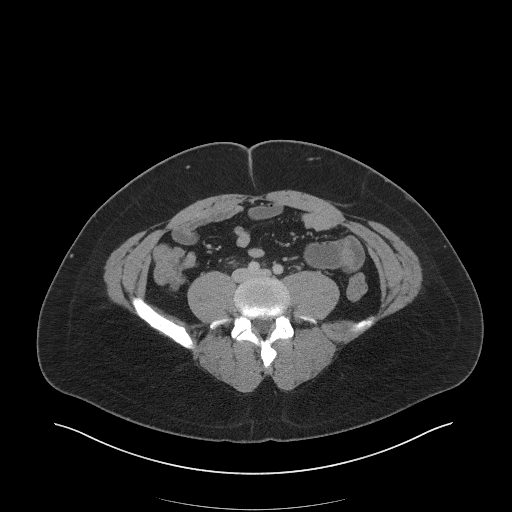
[im 63/108  soft-tissue]
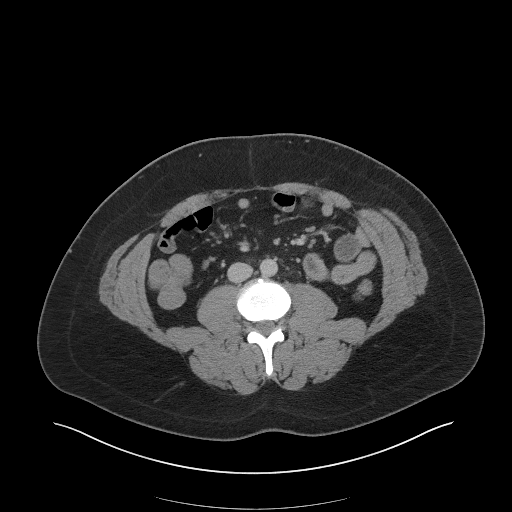
[im 72/108  soft-tissue]
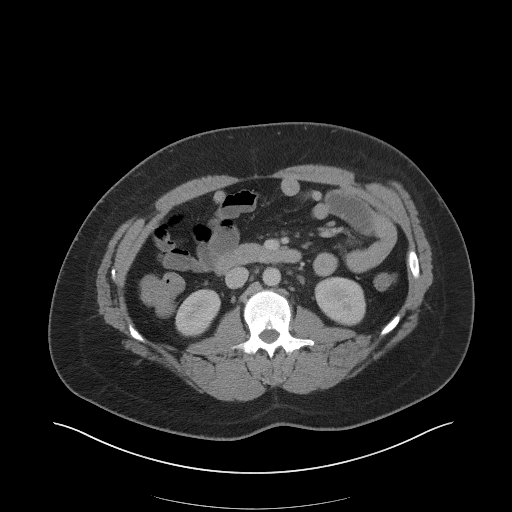
[im 72/108  bone]
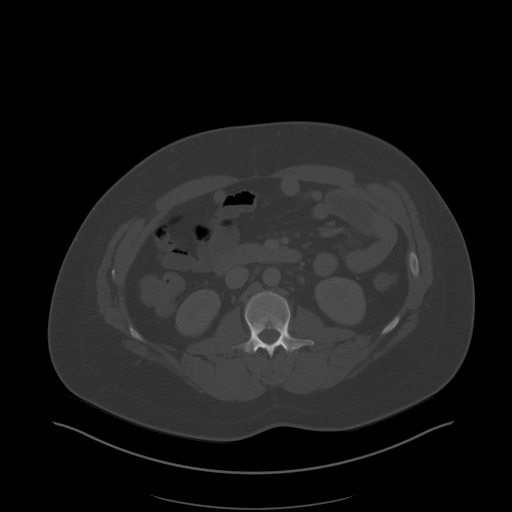
[im 76/108  soft-tissue]
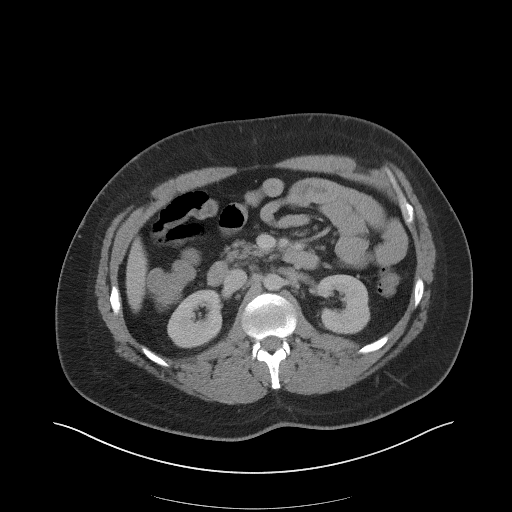
[im 85/108  soft-tissue]
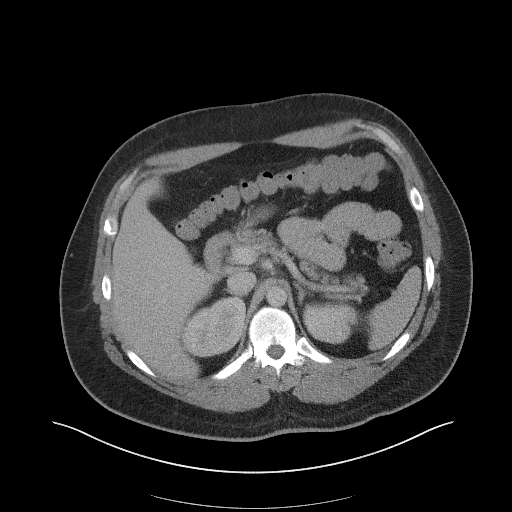
[im 94/108  soft-tissue]
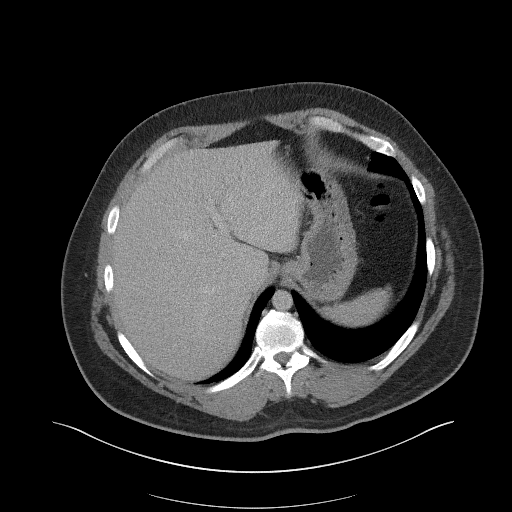
[im 103/108  soft-tissue]
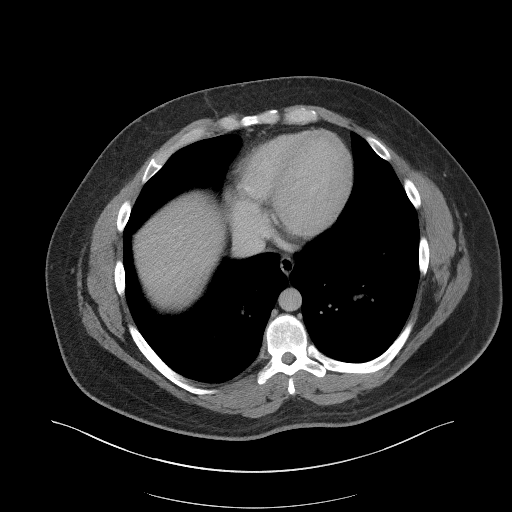

[Series 5: coronal st · coronal · 0.90mm/px · 3 of 119 slices shown]
[im 40/119  soft-tissue]
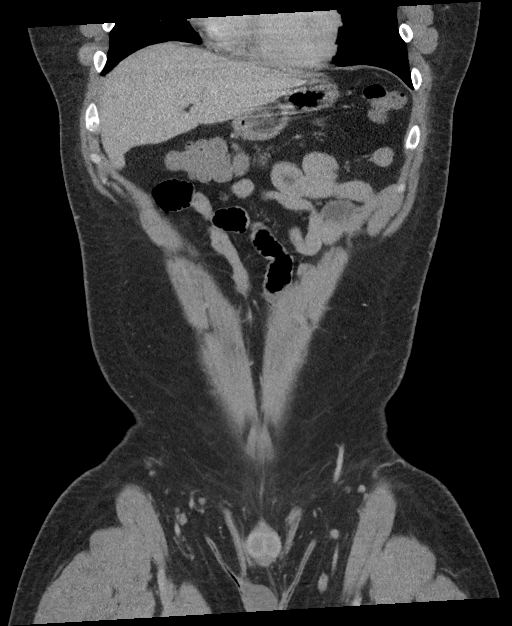
[im 53/119  soft-tissue]
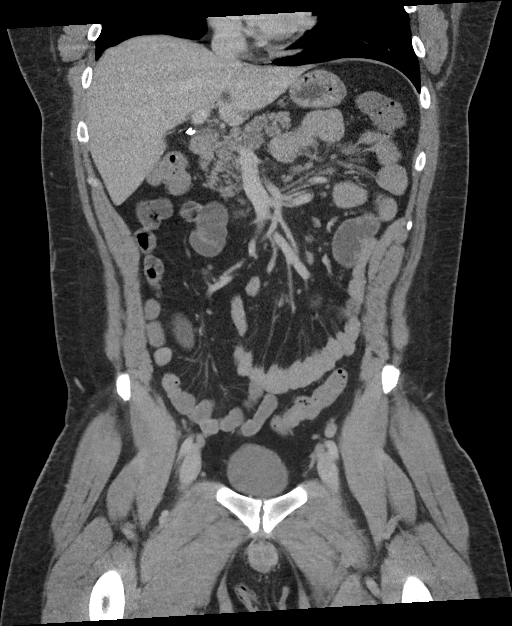
[im 66/119  soft-tissue]
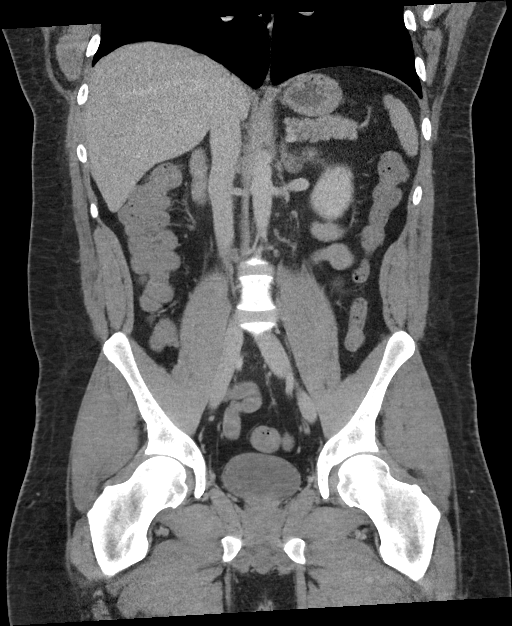

[16 of 46 positions shown; findings below may reference images not displayed]

FINDINGS: Lower chest: No acute abnormality.

Hepatobiliary: No focal liver abnormality is seen. Status post
cholecystectomy. No biliary dilatation.

Pancreas: Unremarkable. No pancreatic ductal dilatation or
surrounding inflammatory changes.

Spleen: Normal in size without focal abnormality.

Adrenals/Urinary Tract: Adrenal glands are unremarkable. Kidneys are
normal, without renal calculi, focal lesion, or hydronephrosis.
Bladder is unremarkable.

Stomach/Bowel: Stomach is within normal limits. Appendix appears
normal. No evidence of bowel wall thickening, distention, or
inflammatory changes.

Vascular/Lymphatic: No significant vascular findings are present. No
enlarged abdominal or pelvic lymph nodes.

Reproductive: Prostate is unremarkable.

Other: No abdominal wall hernia or abnormality. No abdominopelvic
ascites.

Musculoskeletal: No acute or significant osseous findings.
IMPRESSION: 1. No CT evidence of acute intra-abdominal pathology.
2. Status post cholecystectomy.
